# Patient Record
Sex: Female | Born: 1950 | Race: White | Hispanic: No | Marital: Married | Smoking: Current every day smoker
Health system: Southern US, Community
[De-identification: ages and names within clinical notes are randomized; demographics above are authoritative.]

## PROBLEM LIST (undated history)

## (undated) DIAGNOSIS — F32A Depression, unspecified: Secondary | ICD-10-CM

## (undated) DIAGNOSIS — F419 Anxiety disorder, unspecified: Secondary | ICD-10-CM

## (undated) DIAGNOSIS — F4321 Adjustment disorder with depressed mood: Secondary | ICD-10-CM

## (undated) DIAGNOSIS — Q394 Esophageal web: Secondary | ICD-10-CM

## (undated) DIAGNOSIS — Z720 Tobacco use: Secondary | ICD-10-CM

## (undated) DIAGNOSIS — G4700 Insomnia, unspecified: Secondary | ICD-10-CM

## (undated) DIAGNOSIS — T7840XA Allergy, unspecified, initial encounter: Secondary | ICD-10-CM

## (undated) DIAGNOSIS — K200 Eosinophilic esophagitis: Secondary | ICD-10-CM

## (undated) DIAGNOSIS — R1310 Dysphagia, unspecified: Secondary | ICD-10-CM

## (undated) DIAGNOSIS — F329 Major depressive disorder, single episode, unspecified: Secondary | ICD-10-CM

## (undated) DIAGNOSIS — M75100 Unspecified rotator cuff tear or rupture of unspecified shoulder, not specified as traumatic: Secondary | ICD-10-CM

## (undated) DIAGNOSIS — Z634 Disappearance and death of family member: Secondary | ICD-10-CM

## (undated) DIAGNOSIS — G2581 Restless legs syndrome: Secondary | ICD-10-CM

## (undated) DIAGNOSIS — M549 Dorsalgia, unspecified: Secondary | ICD-10-CM

## (undated) DIAGNOSIS — R059 Cough, unspecified: Secondary | ICD-10-CM

## (undated) DIAGNOSIS — K219 Gastro-esophageal reflux disease without esophagitis: Secondary | ICD-10-CM

## (undated) DIAGNOSIS — R05 Cough: Secondary | ICD-10-CM

## (undated) DIAGNOSIS — J449 Chronic obstructive pulmonary disease, unspecified: Secondary | ICD-10-CM

## (undated) DIAGNOSIS — Z9889 Other specified postprocedural states: Secondary | ICD-10-CM

## (undated) DIAGNOSIS — E785 Hyperlipidemia, unspecified: Secondary | ICD-10-CM

## (undated) DIAGNOSIS — R002 Palpitations: Secondary | ICD-10-CM

## (undated) HISTORY — PX: ABDOMINAL HYSTERECTOMY: SHX81

## (undated) HISTORY — DX: Chronic obstructive pulmonary disease, unspecified: J44.9

## (undated) HISTORY — PX: ESOPHAGOGASTRODUODENOSCOPY: SHX1529

## (undated) HISTORY — PX: SKIN CANCER EXCISION: SHX779

## (undated) HISTORY — PX: OTHER SURGICAL HISTORY: SHX169

## (undated) HISTORY — PX: BREAST LUMPECTOMY: SHX2

---

## 2009-07-01 ENCOUNTER — Ambulatory Visit: Payer: Self-pay | Admitting: Family Medicine

## 2010-05-04 ENCOUNTER — Ambulatory Visit: Payer: Self-pay | Admitting: Family Medicine

## 2010-11-27 DIAGNOSIS — G2581 Restless legs syndrome: Secondary | ICD-10-CM | POA: Insufficient documentation

## 2010-11-27 DIAGNOSIS — Z87891 Personal history of nicotine dependence: Secondary | ICD-10-CM | POA: Insufficient documentation

## 2010-11-27 DIAGNOSIS — K200 Eosinophilic esophagitis: Secondary | ICD-10-CM | POA: Insufficient documentation

## 2010-11-27 DIAGNOSIS — Q394 Esophageal web: Secondary | ICD-10-CM | POA: Insufficient documentation

## 2010-11-27 DIAGNOSIS — K219 Gastro-esophageal reflux disease without esophagitis: Secondary | ICD-10-CM | POA: Insufficient documentation

## 2010-11-27 DIAGNOSIS — M549 Dorsalgia, unspecified: Secondary | ICD-10-CM | POA: Insufficient documentation

## 2010-11-27 DIAGNOSIS — F419 Anxiety disorder, unspecified: Secondary | ICD-10-CM | POA: Insufficient documentation

## 2010-11-27 DIAGNOSIS — G4700 Insomnia, unspecified: Secondary | ICD-10-CM | POA: Insufficient documentation

## 2011-06-06 DIAGNOSIS — F329 Major depressive disorder, single episode, unspecified: Secondary | ICD-10-CM | POA: Insufficient documentation

## 2011-06-07 DIAGNOSIS — F4321 Adjustment disorder with depressed mood: Secondary | ICD-10-CM | POA: Insufficient documentation

## 2011-07-19 DIAGNOSIS — R002 Palpitations: Secondary | ICD-10-CM | POA: Insufficient documentation

## 2011-07-19 DIAGNOSIS — Z8639 Personal history of other endocrine, nutritional and metabolic disease: Secondary | ICD-10-CM | POA: Insufficient documentation

## 2011-07-19 DIAGNOSIS — R05 Cough: Secondary | ICD-10-CM | POA: Insufficient documentation

## 2011-08-13 ENCOUNTER — Ambulatory Visit: Payer: Self-pay

## 2011-11-01 ENCOUNTER — Ambulatory Visit: Payer: Self-pay

## 2011-12-25 ENCOUNTER — Ambulatory Visit: Payer: Self-pay | Admitting: Ophthalmology

## 2012-01-15 ENCOUNTER — Ambulatory Visit: Payer: Self-pay | Admitting: Ophthalmology

## 2012-10-09 ENCOUNTER — Ambulatory Visit: Payer: Self-pay

## 2013-01-27 DIAGNOSIS — R933 Abnormal findings on diagnostic imaging of other parts of digestive tract: Secondary | ICD-10-CM | POA: Insufficient documentation

## 2013-01-27 DIAGNOSIS — IMO0001 Reserved for inherently not codable concepts without codable children: Secondary | ICD-10-CM | POA: Insufficient documentation

## 2013-04-07 ENCOUNTER — Ambulatory Visit: Payer: Self-pay | Admitting: Physician Assistant

## 2013-12-17 ENCOUNTER — Ambulatory Visit: Payer: Self-pay | Admitting: Gastroenterology

## 2014-06-08 NOTE — Op Note (Signed)
PATIENT NAME:  Laura Bates, Laura Bates MR#:  [id number] DATE OF BIRTH:  Aug 31, 1950  DATE OF PROCEDURE:  01/15/2012  PREOPERATIVE DIAGNOSIS: Visually significant cataract of the right eye.   POSTOPERATIVE DIAGNOSIS: Visually significant cataract of the right eye.   OPERATIVE PROCEDURE: Cataract extraction by phacoemulsification with implant of intraocular lens to right eye.   SURGEON: Birder Robson, MD.   ANESTHESIA:  1. Managed anesthesia care.  2. Topical tetracaine drops followed by 2% Xylocaine jelly applied in the preoperative holding area.   COMPLICATIONS: None.   TECHNIQUE:  Stop and chop.   DESCRIPTION OF PROCEDURE: The patient was examined and consented in the preoperative holding area where the aforementioned topical anesthesia was applied to the right eye and then brought back to the Operating Room where the right eye was prepped and draped in the usual sterile ophthalmic fashion and a lid speculum was placed. A paracentesis was created with the side port blade and the anterior chamber was filled with viscoelastic. A near clear corneal incision was performed with the steel keratome. A continuous curvilinear capsulorrhexis was performed with a cystotome followed by the capsulorrhexis forceps. Hydrodissection and hydrodelineation were carried out with BSS on a blunt cannula. The lens was removed in a stop and chop technique and the remaining cortical material was removed with the irrigation-aspiration handpiece. The capsular bag was inflated with viscoelastic and the Tecnis ZCB00 22.5-diopter lens, serial number [id number] was placed in the capsular bag without complication. The remaining viscoelastic was removed from the eye with the irrigation-aspiration handpiece. The wounds were hydrated. The anterior chamber was flushed with Miostat and the eye was inflated to physiologic pressure. The wounds were found to be water tight. The eye was dressed with Vigamox. The patient was given protective  glasses to wear throughout the day and a shield with which to sleep tonight. The patient was also given drops with which to begin a drop regimen today and will follow-up with me in one day.  ____________________________ Livingston Diones. Meghna Hagmann, MD wlp:[id number] D: 01/15/2012 16:32:32 ET T: 01/15/2012 16:49:54 ET JOB#: [id number]  cc: Zalayah Pizzuto L. Sharell Hilmer, MD, <Dictator> Livingston Diones Alechia Lezama MD ELECTRONICALLY SIGNED 01/24/2012 15:44

## 2014-06-11 ENCOUNTER — Ambulatory Visit
Admit: 2014-06-11 | Disposition: A | Discharge status: Home / Self Care | Payer: Self-pay | Attending: Family Medicine | Admitting: Family Medicine

## 2014-06-14 LAB — SURGICAL PATHOLOGY

## 2014-07-22 DIAGNOSIS — S43421A Sprain of right rotator cuff capsule, initial encounter: Secondary | ICD-10-CM | POA: Insufficient documentation

## 2014-07-22 DIAGNOSIS — S46119A Strain of muscle, fascia and tendon of long head of biceps, unspecified arm, initial encounter: Secondary | ICD-10-CM | POA: Insufficient documentation

## 2014-08-05 ENCOUNTER — Encounter: Payer: Self-pay | Admitting: Psychiatry

## 2014-08-05 ENCOUNTER — Ambulatory Visit (INDEPENDENT_AMBULATORY_CARE_PROVIDER_SITE_OTHER): Payer: Managed Care, Other (non HMO) | Admitting: Psychiatry

## 2014-08-05 VITALS — BP 125/79 | HR 87 | Resp 12

## 2014-08-05 DIAGNOSIS — F4310 Post-traumatic stress disorder, unspecified: Secondary | ICD-10-CM

## 2014-08-05 DIAGNOSIS — F332 Major depressive disorder, recurrent severe without psychotic features: Secondary | ICD-10-CM

## 2014-08-05 MED ORDER — LORAZEPAM 1 MG PO TABS: 0.5000 mg | ORAL_TABLET | Freq: Two times a day (BID) | ORAL | Status: DC

## 2014-08-05 MED ORDER — ARIPIPRAZOLE 10 MG PO TABS: 10.0000 mg | ORAL_TABLET | Freq: Every day | ORAL | Status: DC

## 2014-08-05 MED ORDER — TRAZODONE HCL 100 MG PO TABS: 150.0000 mg | ORAL_TABLET | Freq: Every evening | ORAL | Status: DC | PRN

## 2014-08-05 MED ORDER — DULOXETINE HCL 60 MG PO CPEP: 60.0000 mg | ORAL_CAPSULE | Freq: Every day | ORAL | Status: DC

## 2014-08-05 MED ORDER — BUPROPION HCL ER (SR) 100 MG PO TB12: 100.0000 mg | ORAL_TABLET | Freq: Two times a day (BID) | ORAL | Status: DC

## 2014-08-05 NOTE — Progress Notes (Signed)
BH MD/PA/NP OP Progress Note  08/05/2014 4:08 PM Laura Bates  MRN:  [id number]  Subjective:  Patient states today verbalizing that she does not think her mood is ever going to get better. She states the loss of both of her sons in close proximity of each other has had a profound effect on her and she does not believe her mood is going to get better. She states however things have not gotten worse. Her biggest complaint is low energy and just a lack of drive to be able to do anything. She states she's only been taking her Wellbutrin once a day despite it being prescribed 3 times a day. She states she's been taking it at bedtime and I explained to her that Wellbutrin is a medication which is best taken earlier in the day and certainly not good take at bedtime.  We discussed perhaps trying to augment her antidepressant. She is very getting Abilify however given that she is been feeling chronically fatigued and not had any significant improvement in her depression in months. Per review of her old records Abilify was started around September 2015. We have decided we will try augmentation with Rexulti. Also instructed her to take Wellbutrin SR 100 mg twice a day as opposed to the 100 mg at bedtime that she was taking. She will continue the Cymbalta for now. She inquired about whether medications are making her sleepy. I did discuss that the ones that are most well-known to make her sleepy would be her lorazepam or trazodone. However she stated that she needs the lorazepam to help her sleep. Chief Complaint:  Chief Complaint    Depression     Visit Diagnosis:  No diagnosis found.  Past Medical History: No past medical history on file. No past surgical history on file. Family History: No family history on file. Social History:  History   Social History  . Marital Status: Married    Spouse Name: N/A  . Number of Children: N/A  . Years of Education: N/A   Social History Main Topics  . Smoking  status: Current Every Day Smoker -- 1.00 packs/day    Types: Cigarettes  . Smokeless tobacco: Not on file  . Alcohol Use: No  . Drug Use: No  . Sexual Activity: Not on file   Other Topics Concern  . None   Social History Narrative  . None   Additional History:   Assessment:   Musculoskeletal: Strength & Muscle Tone: within normal limits Gait & Station: normal Patient leans: N/A  Psychiatric Specialty Exam: HPI  Review of Systems  Psychiatric/Behavioral: Positive for depression (Patient relates that her mood has not changed and associates this with the loss of both of her sons. She states it has not worsened but has not gotten better.). Negative for suicidal ideas, hallucinations, memory loss and substance abuse. The patient has insomnia. The patient is not nervous/anxious.     Blood pressure 125/79, pulse 87, resp. rate 12.There is no height or weight on file to calculate BMI.  General Appearance: Neat and Well Groomed  Eye Contact:  Good  Speech:  Normal Rate  Volume:  Normal  Mood:  The same  Affect:  Constricted  Thought Process:  Linear  Orientation:  Full (Time, Place, and Person)  Thought Content:  Negative  Suicidal Thoughts:  No  Homicidal Thoughts:  No  Memory:  Immediate;   Good Recent;   Good Remote;   Good  Judgement:  Good  Insight:  Good  Psychomotor Activity:  Negative  Concentration:  Good  Recall:  Good  Fund of Knowledge: Good  Language: Good  Akathisia:  Negative  Handed:  Rightunknown  AIMS (if indicated):  Not done  Assets:  Communication Skills Desire for Improvement Social Support  ADL's:  Intact  Cognition: WNL  Sleep:  poor   Is the patient at risk to self?  No. Has the patient been a risk to self in the past 6 months?  No. Has the patient been a risk to self within the distant past?  No. Is the patient a risk to others?  No. Has the patient been a risk to others in the past 6 months?  No. Has the patient been a risk to others  within the distant past?  No.  Current Medications: Current Outpatient Prescriptions  Medication Sig Dispense Refill  . ARIPiprazole (ABILIFY) 5 MG tablet Take by mouth.    Marland Kitchen buPROPion (WELLBUTRIN SR) 100 MG 12 hr tablet Take 1 tablet (100 mg total) by mouth 2 (two) times daily. 60 tablet 2  . buPROPion (WELLBUTRIN) 100 MG tablet Take by mouth.    . DULoxetine (CYMBALTA) 60 MG capsule Take by mouth.    . EPINEPHrine (EPIPEN 2-PAK) 0.3 mg/0.3 mL IJ SOAJ injection     . primidone (MYSOLINE) 50 MG tablet Take by mouth.    . traMADol (ULTRAM) 50 MG tablet Take by mouth.    . traZODone (DESYREL) 100 MG tablet Take by mouth.    . ARIPiprazole (ABILIFY) 10 MG tablet Take 10 mg by mouth daily.  0  . cetirizine ([employer] INDOOR/OUTDOOR ALLERGY RLF) 10 MG tablet Take by mouth.    . doxycycline (VIBRAMYCIN) 100 MG capsule Take 100 mg by mouth 2 (two) times daily.  0  . DULoxetine (CYMBALTA) 60 MG capsule Take 1 capsule (60 mg total) by mouth daily. 30 capsule 2  . LORazepam (ATIVAN) 1 MG tablet Take 0.5 tablets (0.5 mg total) by mouth 2 (two) times daily. 60 tablet 2  . omeprazole (PRILOSEC) 40 MG capsule Take by mouth.    . predniSONE (DELTASONE) 10 MG tablet take 6 tablets by mouth for 2 days then 4 tablets for 2 days then...  (REFER TO PRESCRIPTION NOTES).  0  . primidone (MYSOLINE) 50 MG tablet   1  . traMADol (ULTRAM) 50 MG tablet take 1 tablet by mouth every 6 hours if needed for pain  0  . traZODone (DESYREL) 100 MG tablet Take 1.5 tablets (150 mg total) by mouth at bedtime as needed for sleep. 45 tablet 2   No current facility-administered medications for this visit.    Medical Decision Making:  Review of Medication Regimen & Side Effects (2) and Review of New Medication or Change in Dosage (2)  Treatment Plan Summary:Medication management patient will continue her Cymbalta 60 mg a day. She will continue her lorazepam 1 mg twice daily. She'll continue her trazodone 150 mg at bedtime. I have  instructed her to discontinue the Abilify and provide her some samples of rexulti. I have instructed her to take 0.5 mg for 7 days and then go up to 1 mg a day. Risk and benefits of been discussing patient's able to consent. I have instructed patient follow up in 1 month. I've also instructed to call me with any questions or concerns regarding the trial of Rexulti.    Faith Rogue 08/05/2014, 4:08 PM

## 2014-08-05 NOTE — Addendum Note (Signed)
Addended by: Marjie Skiff on: 08/05/2014 04:41 PM   Modules accepted: Orders

## 2014-08-16 ENCOUNTER — Other Ambulatory Visit: Payer: Self-pay

## 2014-08-16 DIAGNOSIS — T7840XA Allergy, unspecified, initial encounter: Secondary | ICD-10-CM | POA: Insufficient documentation

## 2014-08-16 NOTE — Telephone Encounter (Signed)
pt states she seen you on 08-05-14 and that you gave her some samples of rexulti 1mg  and pt states she is doing ok on it and would like for you to send a rx to [facility] in [address]

## 2014-08-17 MED ORDER — BREXPIPRAZOLE 1 MG PO TABS: 1.0000 | ORAL_TABLET | Freq: Every day | ORAL | Status: DC

## 2014-08-18 NOTE — Telephone Encounter (Signed)
spoke with patient rx was sent and prior authorization was done and sent to insurance company

## 2014-10-12 ENCOUNTER — Other Ambulatory Visit: Payer: Self-pay | Admitting: Psychiatry

## 2014-10-14 DIAGNOSIS — Z9889 Other specified postprocedural states: Secondary | ICD-10-CM | POA: Insufficient documentation

## 2014-10-19 ENCOUNTER — Encounter: Payer: Self-pay | Admitting: Psychiatry

## 2014-10-19 ENCOUNTER — Ambulatory Visit (INDEPENDENT_AMBULATORY_CARE_PROVIDER_SITE_OTHER): Payer: Managed Care, Other (non HMO) | Admitting: Psychiatry

## 2014-10-19 VITALS — BP 124/76 | HR 90 | Temp 98.3°F | Ht 62.5 in | Wt 124.8 lb

## 2014-10-19 DIAGNOSIS — F332 Major depressive disorder, recurrent severe without psychotic features: Secondary | ICD-10-CM

## 2014-10-19 DIAGNOSIS — F4310 Post-traumatic stress disorder, unspecified: Secondary | ICD-10-CM

## 2014-10-19 MED ORDER — DULOXETINE HCL 60 MG PO CPEP: 60.0000 mg | ORAL_CAPSULE | Freq: Every day | ORAL | Status: AC

## 2014-10-19 MED ORDER — BUPROPION HCL ER (SR) 100 MG PO TB12: 100.0000 mg | ORAL_TABLET | Freq: Two times a day (BID) | ORAL | Status: AC

## 2014-10-19 MED ORDER — LORAZEPAM 1 MG PO TABS: 1.0000 mg | ORAL_TABLET | Freq: Two times a day (BID) | ORAL | Status: DC

## 2014-10-19 MED ORDER — TRAZODONE HCL 100 MG PO TABS: 150.0000 mg | ORAL_TABLET | Freq: Every evening | ORAL | Status: AC | PRN

## 2014-10-19 NOTE — Progress Notes (Signed)
BH MD/PA/NP OP Progress Note  10/19/2014 3:23 PM Laura Bates  MRN:  [id number]  Subjective:  Patient states today indicating that things have been the same. She is able to state they're not any better and are not any worse. She initially had contacted the clinic saying the samples of Rick salty worked and thus Armed forces training and education officer sent in a prescription. However patient states that there was a lot of trouble with her insurance she never followed through with trying to continue it but then went back on her Abilify. She states however the past couple months she's noticed that she has been touching her thighs and having some deliberate movements which she is able to stop when she notices it. However I did discuss that there is a side effect of the akathisia/restlessness from Abilify. We've decided we will give a try with her discontinuing the Abilify.  He did remind this Probation officer that her dose of lorazepam been 1 mg twice a day and at the last visit it was written for 0.5 mg twice a day.    Past about any new events or anything she is enjoyed. She states that over the summer she and her husband did move. She states that they've always owned a home and they decided to sell their home and move toward apartment. She states she is surprised because she likes this change because it does not, with all the work that required in up keeping house. Chief Complaint: Same Chief Complaint    Follow-up; Medication Refill; Anxiety; Depression     Visit Diagnosis:  No diagnosis found.  Past Medical History: History reviewed. No pertinent past medical history.  Past Surgical History  Procedure Laterality Date  . Abdominal hysterectomy    . Skin cancer excision     Family History:  Family History  Problem Relation Age of Onset  . Heart attack Father   . Diabetes Father    Social History:  Social History   Social History  . Marital Status: Married    Spouse Name: N/A  . Number of Children: N/A  . Years of  Education: N/A   Social History Main Topics  . Smoking status: Current Every Day Smoker -- 1.00 packs/day    Types: Cigarettes    Start date: 10/19/1974  . Smokeless tobacco: Never Used  . Alcohol Use: No  . Drug Use: No  . Sexual Activity: Yes    Birth Control/ Protection: None   Other Topics Concern  . None   Social History Narrative   Additional History:   Assessment:   Musculoskeletal: Strength & Muscle Tone: within normal limits Gait & Station: normal Patient leans: N/A  Psychiatric Specialty Exam: Anxiety Symptoms include insomnia. Patient reports no nervous/anxious behavior or suicidal ideas.    Depression        Associated symptoms include insomnia.  Associated symptoms include no suicidal ideas.  Past medical history includes anxiety.     Review of Systems  Psychiatric/Behavioral: Positive for depression (at baseline). Negative for suicidal ideas, hallucinations, memory loss and substance abuse. The patient has insomnia. The patient is not nervous/anxious.     Blood pressure 124/76, pulse 90, temperature 98.3 F (36.8 C), temperature source Tympanic, height 5' 2.5" (1.588 m), weight 124 lb 12.8 oz (56.609 kg), SpO2 94 %.Body mass index is 22.45 kg/(m^2).  General Appearance: Neat and Well Groomed  Eye Contact:  Good  Speech:  Normal Rate  Volume:  Normal  Mood:  The same  Affect:  Constricted  Thought Process:  Linear  Orientation:  Full (Time, Place, and Person)  Thought Content:  Negative  Suicidal Thoughts:  No  Homicidal Thoughts:  No  Memory:  Immediate;   Good Recent;   Good Remote;   Good  Judgement:  Good  Insight:  Good  Psychomotor Activity:  Negative  Concentration:  Good  Recall:  Good  Fund of Knowledge: Good  Language: Good  Akathisia:  Negative  Handed:  Rightunknown  AIMS (if indicated):  Done today, normal. Asian and is treated by her primary care for benign essential tremor with primidone   Assets:  Communication Skills Desire  for Improvement Social Support  ADL's:  Intact  Cognition: WNL  Sleep:  poor   Is the patient at risk to self?  No. Has the patient been a risk to self in the past 6 months?  No. Has the patient been a risk to self within the distant past?  No. Is the patient a risk to others?  No. Has the patient been a risk to others in the past 6 months?  No. Has the patient been a risk to others within the distant past?  No.  Current Medications: Current Outpatient Prescriptions  Medication Sig Dispense Refill  . buPROPion (WELLBUTRIN SR) 100 MG 12 hr tablet take 1 tablet by mouth twice a day 60 tablet 1  . cetirizine ([employer] INDOOR/OUTDOOR ALLERGY RLF) 10 MG tablet Take by mouth.    . DULoxetine (CYMBALTA) 60 MG capsule Take 1 capsule (60 mg total) by mouth daily. 30 capsule 2  . LORazepam (ATIVAN) 1 MG tablet Take 1 tablet (1 mg total) by mouth 2 (two) times daily. 60 tablet 3  . omeprazole (PRILOSEC) 40 MG capsule Take by mouth.    . primidone (MYSOLINE) 50 MG tablet Take by mouth.    . primidone (MYSOLINE) 50 MG tablet   1  . traZODone (DESYREL) 100 MG tablet Take 1.5 tablets (150 mg total) by mouth at bedtime as needed for sleep. 45 tablet 2  . traZODone (DESYREL) 100 MG tablet Take by mouth.    . doxycycline (VIBRAMYCIN) 100 MG capsule Take 100 mg by mouth 2 (two) times daily.  0  . DULoxetine (CYMBALTA) 60 MG capsule Take by mouth.     No current facility-administered medications for this visit.    Medical Decision Making:  Review of Medication Regimen & Side Effects (2) and Review of New Medication or Change in Dosage (2)  Treatment Plan Summary:Medication management patient will continue her Cymbalta 60 mg a day. She will continue her lorazepam 1 mg twice daily. She'll continue her trazodone 150 mg at bedtime.  She'll continue the Wellbutrin SR 100 mg twice a day. She will follow up in 4 months. Patient wanted to extend the visits until 6 months however I indicated that I was comfortable  with 4 months. She has been stable on this medication regimen. Her to call any questions or concerns prior to her next appointment. Discontinue the Abilify as noted above     Faith Rogue 10/19/2014, 3:23 PM

## 2014-10-19 NOTE — Addendum Note (Signed)
Addended by: Marjie Skiff on: 10/19/2014 03:33 PM   Modules accepted: Orders

## 2014-10-22 ENCOUNTER — Ambulatory Visit: Payer: Self-pay | Admitting: Psychiatry

## 2014-10-22 ENCOUNTER — Other Ambulatory Visit: Payer: Self-pay | Admitting: Family Medicine

## 2014-11-02 ENCOUNTER — Ambulatory Visit: Payer: Self-pay | Admitting: Psychiatry

## 2014-11-09 ENCOUNTER — Other Ambulatory Visit: Payer: Self-pay | Admitting: Psychiatry

## 2015-02-09 ENCOUNTER — Ambulatory Visit: Payer: Self-pay | Admitting: Psychiatry

## 2015-02-18 ENCOUNTER — Emergency Department: Payer: Medicare Other

## 2015-02-18 ENCOUNTER — Emergency Department
Admission: EM | Admit: 2015-02-18 | Discharge: 2015-02-18 | Disposition: A | Discharge status: Home / Self Care | Payer: Medicare Other | Attending: Emergency Medicine | Admitting: Emergency Medicine

## 2015-02-18 DIAGNOSIS — Z880 Allergy status to penicillin: Secondary | ICD-10-CM | POA: Diagnosis not present

## 2015-02-18 DIAGNOSIS — Z7952 Long term (current) use of systemic steroids: Secondary | ICD-10-CM | POA: Insufficient documentation

## 2015-02-18 DIAGNOSIS — Z79899 Other long term (current) drug therapy: Secondary | ICD-10-CM | POA: Diagnosis not present

## 2015-02-18 DIAGNOSIS — Z792 Long term (current) use of antibiotics: Secondary | ICD-10-CM | POA: Insufficient documentation

## 2015-02-18 DIAGNOSIS — J209 Acute bronchitis, unspecified: Secondary | ICD-10-CM

## 2015-02-18 DIAGNOSIS — R0602 Shortness of breath: Secondary | ICD-10-CM | POA: Diagnosis present

## 2015-02-18 DIAGNOSIS — F17210 Nicotine dependence, cigarettes, uncomplicated: Secondary | ICD-10-CM | POA: Diagnosis not present

## 2015-02-18 LAB — CBC WITH DIFFERENTIAL/PLATELET
BASOS ABS: 0.1 10*3/uL (ref 0–0.1)
Basophils Relative: 1 %
EOS PCT: 1 %
Eosinophils Absolute: 0.1 10*3/uL (ref 0–0.7)
HCT: 40.6 % (ref 35.0–47.0)
Hemoglobin: 13.3 g/dL (ref 12.0–16.0)
Lymphocytes Relative: 33 %
Lymphs Abs: 3.7 10*3/uL — ABNORMAL HIGH (ref 1.0–3.6)
MCH: 30.5 pg (ref 26.0–34.0)
MCHC: 32.9 g/dL (ref 32.0–36.0)
MCV: 92.9 fL (ref 80.0–100.0)
Monocytes Absolute: 0.7 10*3/uL (ref 0.2–0.9)
Monocytes Relative: 6 %
Neutro Abs: 6.7 10*3/uL — ABNORMAL HIGH (ref 1.4–6.5)
Neutrophils Relative %: 59 %
Platelets: 212 10*3/uL (ref 150–440)
RBC: 4.37 MIL/uL (ref 3.80–5.20)
RDW: 15.2 % — ABNORMAL HIGH (ref 11.5–14.5)
WBC: 11.3 10*3/uL — ABNORMAL HIGH (ref 3.6–11.0)

## 2015-02-18 LAB — BASIC METABOLIC PANEL
Anion gap: 9 (ref 5–15)
BUN: 12 mg/dL (ref 6–20)
CO2: 25 mmol/L (ref 22–32)
Calcium: 9.2 mg/dL (ref 8.9–10.3)
Chloride: 103 mmol/L (ref 101–111)
Creatinine, Ser: 0.88 mg/dL (ref 0.44–1.00)
GFR calc Af Amer: >60 mL/min (ref >60)
Glucose, Bld: 92 mg/dL (ref 65–99)
Potassium: 3.9 mmol/L (ref 3.5–5.1)
Sodium: 137 mmol/L (ref 135–145)

## 2015-02-18 LAB — FIBRIN DERIVATIVES D-DIMER (ARMC ONLY): FIBRIN DERIVATIVES D-DIMER (ARMC): 461 (ref 0–499)

## 2015-02-18 MED ORDER — IPRATROPIUM-ALBUTEROL 0.5-2.5 (3) MG/3ML IN SOLN: 3.0000 mL | Freq: Once | RESPIRATORY_TRACT | Status: DC

## 2015-02-18 MED ORDER — SODIUM CHLORIDE 0.9 % IV BOLUS (SEPSIS)
1000.0000 mL | Freq: Once | INTRAVENOUS | Status: AC
  Administered 2015-02-18: 1000 mL via INTRAVENOUS

## 2015-02-18 MED ORDER — AZITHROMYCIN 250 MG PO TABS: ORAL_TABLET | ORAL | Status: DC

## 2015-02-18 MED ORDER — ALBUTEROL SULFATE HFA 108 (90 BASE) MCG/ACT IN AERS: 2.0000 | INHALATION_SPRAY | RESPIRATORY_TRACT | Status: DC | PRN

## 2015-02-18 MED ORDER — PREDNISONE 20 MG PO TABS: 40.0000 mg | ORAL_TABLET | Freq: Every day | ORAL | Status: DC

## 2015-02-18 MED ORDER — PREDNISONE 20 MG PO TABS: 40.0000 mg | ORAL_TABLET | Freq: Once | ORAL | Status: DC

## 2015-02-18 NOTE — Discharge Instructions (Signed)

## 2015-02-18 NOTE — ED Notes (Signed)
Generalized weakness X 5 weeks and SOB X 3 days. Primary care reports that pt oxygen was 88% on RA. Pt sent for further evaluation of PE. Pt denies pain. No swelling/edema present. Pt alert and oriented X4, active, cooperative, pt in NAD. RR even and unlabored, color WNL.

## 2015-02-18 NOTE — ED Provider Notes (Signed)
[facility] Emergency Department Provider Note  ____________________________________________  Time seen: 1:10 PM  I have reviewed the triage vital signs and the nursing notes.   HISTORY  Chief Complaint Shortness of Breath    HPI Laura Bates is a 64 y.o. female who complains of generalized weakness for 5 weeks and shortness of breath for 3 days. Also has a nonproductive cough. Was seen in clinic and found to have an oxygen saturation 88% on room air. Was sent to the ED for evaluation for PE due to family history of PE. Patient does report that both of her sons recently died and she's been on antidepressants for the past few months. Denies any chest pain. No travel trauma hospitalizations or surgeries. Has had decreased activity over the past month. Denies any leg pain or swelling. No dizziness or dyspnea on exertion.     History reviewed. No pertinent past medical history.   Patient Active Problem List   Diagnosis Date Noted  . H/O shoulder surgery 10/14/2014  . Allergic state 08/16/2014  . Rupture long head biceps tendon 07/22/2014  . Sprain of right rotator cuff capsule 07/22/2014  . Can't get food down 01/27/2013  . Abnormal finding on GI tract imaging 01/27/2013  . Cough 07/19/2011  . H/O elevated lipids 07/19/2011  . Awareness of heartbeats 07/19/2011  . Aggrieved 06/07/2011  . Clinical depression 06/06/2011  . Acid reflux 11/27/2010  . Anxiety 11/27/2010  . Back ache 11/27/2010  . EE (eosinophilic esophagitis) [id number]  . 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 History of tobacco use 11/27/2010  . Cannot sleep 11/27/2010  . Restless leg 11/27/2010     Past Surgical History  Procedure Laterality Date  . Abdominal hysterectomy    . Skin cancer excision       Current Outpatient Rx  Name  Route  Sig  Dispense  Refill  . albuterol (PROVENTIL HFA) 108 (90 Base) MCG/ACT inhaler   Inhalation   Inhale 2 puffs into the lungs every 4  (four) hours as needed for wheezing or shortness of breath.   1 Inhaler   0   . ARIPiprazole (ABILIFY) 10 MG tablet      TAKE 1 TABLET BY MOUTH DAILY ,( DO NOT TAKE WITH REXULTI)   30 tablet   3   . azithromycin (ZITHROMAX Z-PAK) 250 MG tablet      Take 2 tablets (500 mg) on  Day 1,  followed by 1 tablet (250 mg) once daily on Days 2 through 5.   6 each   0   . buPROPion (WELLBUTRIN SR) 100 MG 12 hr tablet   Oral   Take 1 tablet (100 mg total) by mouth 2 (two) times daily.   60 tablet   3     Hold until patient calls pharmacy for refill as sh ...   . cetirizine ([employer] INDOOR/OUTDOOR ALLERGY RLF) 10 MG tablet   Oral   Take by mouth.         . doxycycline (VIBRAMYCIN) 100 MG capsule   Oral   Take 100 mg by mouth 2 (two) times daily.      0   . DULoxetine (CYMBALTA) 60 MG capsule   Oral   Take by mouth.         . DULoxetine (CYMBALTA) 60 MG capsule   Oral   Take 1 capsule (60 mg total) by mouth daily.   30 capsule   3     Hold until patient  calls pharmacy for refill as sh ...   . LORazepam (ATIVAN) 1 MG tablet   Oral   Take 1 tablet (1 mg total) by mouth 2 (two) times daily.   60 tablet   3   . omeprazole (PRILOSEC) 40 MG capsule   Oral   Take by mouth.         . predniSONE (DELTASONE) 20 MG tablet   Oral   Take 2 tablets (40 mg total) by mouth daily.   8 tablet   0   . primidone (MYSOLINE) 50 MG tablet   Oral   Take by mouth.         . primidone (MYSOLINE) 50 MG tablet            1   . traZODone (DESYREL) 100 MG tablet   Oral   Take by mouth.         . traZODone (DESYREL) 100 MG tablet   Oral   Take 1.5 tablets (150 mg total) by mouth at bedtime as needed for sleep.   45 tablet   3     Hold until patient calls pharmacy for refill as sh ...      Allergies Oxycodone; Cephalexin; Hydrocodone; and Penicillins   Family History  Problem Relation Age of Onset  . Heart attack Father   . Diabetes Father     Social  History Social History  Substance Use Topics  . Smoking status: Current Every Day Smoker -- 1.00 packs/day    Types: Cigarettes    Start date: 10/19/1974  . Smokeless tobacco: Never Used  . Alcohol Use: No    Review of Systems  Constitutional:   No fever or chills. No weight changes Eyes:   No blurry vision or double vision.  ENT:   No sore throat. Cardiovascular:   No chest pain. Respiratory:   Positive shortness of breath and occasional nonproductive cough Gastrointestinal:   Negative for abdominal pain, vomiting and diarrhea.  No BRBPR or melena. Genitourinary:   Negative for dysuria, urinary retention, bloody urine, or difficulty urinating. Musculoskeletal:   Negative for back pain. No joint swelling or pain. Skin:   Negative for rash. Neurological:   Negative for headaches, focal weakness or numbness. Psychiatric:  No anxiety or depression.   Endocrine:  No hot/cold intolerance, changes in energy, or sleep difficulty.  10-point ROS otherwise negative.  ____________________________________________   PHYSICAL EXAM:  VITAL SIGNS: ED Triage Vitals  Enc Vitals Group     BP 02/18/15 1317 130/53 mmHg     Pulse Rate 02/18/15 1317 78     Resp 02/18/15 1317 18     Temp 02/18/15 1317 98.2 F (36.8 C)     Temp Source 02/18/15 1317 Oral     SpO2 02/18/15 1317 98 %     Weight 02/18/15 1317 130 lb (58.968 kg)     Height 02/18/15 1317 5\' 2"  (1.575 m)     Head Cir --      Peak Flow --      Pain Score --      Pain Loc --      Pain Edu? --      Excl. in [place]? --     Vital signs reviewed, nursing assessments reviewed.   Constitutional:   Alert and oriented. Well appearing and in no distress. Eyes:   No scleral icterus. No conjunctival pallor. PERRL. EOMI ENT   Head:   Normocephalic and atraumatic.   Nose:   No  congestion/rhinnorhea. No septal hematoma   Mouth/Throat:   MMM, no pharyngeal erythema. No peritonsillar mass. No uvula shift.   Neck:   No stridor.  No SubQ emphysema. No meningismus. Hematological/Lymphatic/Immunilogical:   No cervical lymphadenopathy. Cardiovascular:   RRR. Normal and symmetric distal pulses are present in all extremities. No murmurs, rubs, or gallops. Respiratory:   Normal respiratory effort without tachypnea nor retractions. Diffuse mild expiratory wheezing, accentuated with forcible expiration. Normal expiratory phase. Oxygen saturation 98% on room air Gastrointestinal:   Soft and nontender. No distention. There is no CVA tenderness.  No rebound, rigidity, or guarding. Genitourinary:   deferred Musculoskeletal:   Nontender with normal range of motion in all extremities. No joint effusions.  No lower extremity tenderness.  No edema. Symmetric. No Soft Tissue Tenderness Anywhere in the Bilateral Lower Extremity Is.  Neurologic:   Normal speech and language.  CN 2-10 normal. Motor grossly intact. No pronator drift.  Normal gait. No gross focal neurologic deficits are appreciated.  Skin:    Skin is warm, dry and intact. No rash noted.  No petechiae, purpura, or bullae. Psychiatric:   Mood and affect are normal. Speech and behavior are normal. Patient exhibits appropriate insight and judgment.  ____________________________________________    LABS (pertinent positives/negatives) (all labs ordered are listed, but only abnormal results are displayed) Labs Reviewed  CBC WITH DIFFERENTIAL/PLATELET - Abnormal; Notable for the following:    WBC 11.3 (*)    RDW 15.2 (*)    Neutro Abs 6.7 (*)    Lymphs Abs 3.7 (*)    All other components within normal limits  BASIC METABOLIC PANEL  FIBRIN DERIVATIVES D-DIMER ([facility] ONLY)   ____________________________________________   EKG   Interpreted by me  Date: 02/18/2015  Rate: 79  Rhythm: normal sinus rhythm  QRS Axis: normal  Intervals: normal  ST/T Wave abnormalities: normal  Conduction Disutrbances: none  Narrative Interpretation:  unremarkable      ____________________________________________    RADIOLOGY  Chest x-ray unremarkable  ____________________________________________   PROCEDURES   ____________________________________________   INITIAL IMPRESSION / ASSESSMENT AND PLAN / ED COURSE  Pertinent labs & imaging results that were available during my care of the patient were reviewed by me and considered in my medical decision making (see chart for details).  Patient presents with shortness of breath and exam the clinically is consistent with bronchospasm and bronchitis. Low suspicion for PE but given her family history and decreased activity recently, even that there are no clinical signs on exam or vital signs, we'll do a d-dimer was labs. We'll give steroids and a DuoNeb as well for symptomatically relief.  ----------------------------------------- 3:29 PM on 02/18/2015 -----------------------------------------  Workup negative. D-dimer within normal. Vital signs stable. No respiratory distress. No hypoxia tachycardia tachypnea or hypotension. No chest pain. EKG does not show any evidence of right heart strain. We'll discharge with a course of steroids albuterol and azithromycin to follow up with primary care.     ____________________________________________   FINAL CLINICAL IMPRESSION(S) / ED DIAGNOSES  Final diagnoses:  Bronchospasm with bronchitis, acute      Carrie Mew, MD 02/18/15 1530

## 2015-02-18 NOTE — ED Notes (Signed)
Pt arrives from Urgent Care by [facility] for evaluation of SOB x 3 days. Pt alert and oriented X4, active, cooperative, pt in NAD. RR even and unlabored, color WNL.

## 2015-03-09 ENCOUNTER — Encounter: Payer: Self-pay | Admitting: Cardiovascular Disease

## 2015-03-09 ENCOUNTER — Ambulatory Visit (INDEPENDENT_AMBULATORY_CARE_PROVIDER_SITE_OTHER): Payer: Medicare Other | Admitting: Cardiovascular Disease

## 2015-03-09 VITALS — BP 94/62 | HR 88 | Ht 62.0 in | Wt 133.0 lb

## 2015-03-09 DIAGNOSIS — R0602 Shortness of breath: Secondary | ICD-10-CM

## 2015-03-09 DIAGNOSIS — Z720 Tobacco use: Secondary | ICD-10-CM | POA: Insufficient documentation

## 2015-03-09 DIAGNOSIS — R0609 Other forms of dyspnea: Secondary | ICD-10-CM | POA: Diagnosis not present

## 2015-03-09 NOTE — Patient Instructions (Addendum)
Medication Instructions:  Your physician recommends that you continue on your current medications as directed. Please refer to the Current Medication list given to you today.   Labwork: none  Testing/Procedures: Your physician has requested that you have a lexiscan myoview. For further information please visit [email or web address]. Please follow instruction sheet, as given.  [place]  Your caregiver has ordered a Stress Test with nuclear imaging. The purpose of this test is to evaluate the blood supply to your heart muscle. This procedure is referred to as a "Non-Invasive Stress Test." This is because other than having an IV started in your vein, nothing is inserted or "invades" your body. Cardiac stress tests are done to find areas of poor blood flow to the heart by determining the extent of coronary artery disease (CAD). Some patients exercise on a treadmill, which naturally increases the blood flow to your heart, while others who are  unable to walk on a treadmill due to physical limitations have a pharmacologic/chemical stress agent called Lexiscan . This medicine will mimic walking on a treadmill by temporarily increasing your coronary blood flow.   Please note: these test may take anywhere between 2-4 hours to complete  PLEASE REPORT TO [place] AT THE FIRST DESK WILL DIRECT YOU WHERE TO GO  Date of Procedure: Friday, Jan 20  Arrival Time for Procedure: 8:15am  Instructions regarding medication:   You may take your morning medications as prescribed.  Please bring your inhaler.   PLEASE NOTIFY THE OFFICE AT LEAST 85 HOURS IN ADVANCE IF YOU ARE UNABLE TO KEEP YOUR APPOINTMENT.  [phone] AND  PLEASE NOTIFY NUCLEAR MEDICINE AT [facility] AT LEAST 24 HOURS IN ADVANCE IF YOU ARE UNABLE TO KEEP YOUR APPOINTMENT. [phone]  How to prepare for your Myoview test:   Do not eat or drink after midnight  No caffeine for 24 hours prior to test  No  smoking 24 hours prior to test.  Your medication may be taken with water.  If your doctor stopped a medication because of this test, do not take that medication.  Ladies, please do not wear dresses.  Skirts or pants are appropriate. Please wear a short sleeve shirt.  No perfume, cologne or lotion.  Wear comfortable walking shoes. No heels!   Your physician has requested that you have an echocardiogram. Echocardiography is a painless test that uses sound waves to create images of your heart. It provides your doctor with information about the size and shape of your heart and how well your heart's chambers and valves are working. This procedure takes approximately one hour. There are no restrictions for this procedure.           Follow-Up: Your physician recommends that you schedule a follow-up appointment as needed.   Any Other Special Instructions Will Be Listed Below (If Applicable).     If you need a refill on your cardiac medications before your next appointment, please call your pharmacy.  Echocardiogram An echocardiogram, or echocardiography, uses sound waves (ultrasound) to produce an image of your heart. The echocardiogram is simple, painless, obtained within a short period of time, and offers valuable information to your health care provider. The images from an echocardiogram can provide information such as:  Evidence of coronary artery disease (CAD).  Heart size.  Heart muscle function.  Heart valve function.  Aneurysm detection.  Evidence of a past heart attack.  Fluid buildup around the heart.  Heart muscle thickening.  Assess heart valve  function. LET [facility] CARE PROVIDER KNOW ABOUT:  Any allergies you have.  All medicines you are taking, including vitamins, herbs, eye drops, creams, and over-the-counter medicines.  Previous problems you or members of your family have had with the use of anesthetics.  Any blood disorders you have.  Previous  surgeries you have had.  Medical conditions you have.  Possibility of pregnancy, if this applies. BEFORE THE PROCEDURE  No special preparation is needed. Eat and drink normally.  PROCEDURE   In order to produce an image of your heart, gel will be applied to your chest and a wand-like tool (transducer) will be moved over your chest. The gel will help transmit the sound waves from the transducer. The sound waves will harmlessly bounce off your heart to allow the heart images to be captured in real-time motion. These images will then be recorded.  You may need an IV to receive a medicine that improves the quality of the pictures. AFTER THE PROCEDURE You may return to your normal schedule including diet, activities, and medicines, unless your health care provider tells you otherwise.   This information is not intended to replace advice given to you by your health care provider. Make sure you discuss any questions you have with your health care provider.   Document Released: 02/03/2000 Document Revised: 02/26/2014 Document Reviewed: 10/13/2012 Elsevier Interactive Patient Education 2016 [place]. Cardiac Nuclear Scanning A cardiac nuclear scan is used to check your heart for problems, such as the following:  A portion of the heart is not getting enough blood.  Part of the heart muscle has died, which happens with a heart attack.  The heart wall is not working normally.  In this test, a radioactive dye (tracer) is injected into your bloodstream. After the tracer has traveled to your heart, a scanning device is used to measure how much of the tracer is absorbed by or distributed to various areas of your heart. LET [facility] CARE PROVIDER KNOW ABOUT:  Any allergies you have.  All medicines you are taking, including vitamins, herbs, eye drops, creams, and over-the-counter medicines.  Previous problems you or members of your family have had with the use of anesthetics.  Any blood  disorders you have.  Previous surgeries you have had.  Medical conditions you have.  RISKS AND COMPLICATIONS Generally, this is a safe procedure. However, as with any procedure, problems can occur. Possible problems include:   Serious chest pain.  Rapid heartbeat.  Sensation of warmth in your chest. This usually passes quickly. BEFORE THE PROCEDURE Ask your health care provider about changing or stopping your regular medicines. PROCEDURE This procedure is usually done at a hospital and takes 2-4 hours.  An IV tube is inserted into one of your veins.  Your health care provider will inject a small amount of radioactive tracer through the tube.  You will then wait for 20-40 minutes while the tracer travels through your bloodstream.  You will lie down on an exam table so images of your heart can be taken. Images will be taken for about 15-20 minutes.  You will exercise on a treadmill or stationary bike. While you exercise, your heart activity will be monitored with an electrocardiogram (ECG), and your blood pressure will be checked.  If you are unable to exercise, you may be given a medicine to make your heart beat faster.  When blood flow to your heart has peaked, tracer will again be injected through the IV tube.  After  20-40 minutes, you will get back on the exam table and have more images taken of your heart.  When the procedure is over, your IV tube will be removed. AFTER THE PROCEDURE  You will likely be able to leave shortly after the test. Unless your health care provider tells you otherwise, you may return to your normal schedule, including diet, activities, and medicines.  Make sure you find out how and when you will get your test results.   This information is not intended to replace advice given to you by your health care provider. Make sure you discuss any questions you have with your health care provider.   Document Released: 03/02/2004 Document Revised:  02/10/2013 Document Reviewed: 01/14/2013 Elsevier Interactive Patient Education [employer].

## 2015-03-09 NOTE — Assessment & Plan Note (Signed)
I discussed with her the importance of smoking cessation. She does want to quit and has been able to do so in the past with Chantix. She is trying to get herself off her anxiety and depression medication before she starts this. She does follow-up with psychiatry.

## 2015-03-09 NOTE — Progress Notes (Signed)
Primary care physician: Dr. Norman Clay  HPI  This is a pleasant 65 year old female who was referred by Dr. Vickki Muff for evaluation of exertional dyspnea. She has no previous cardiac history. She has prolonged history of tobacco use and recent COPD. She also suffers from anxiety and depression that was significantly exacerbated in 2013 after she lost both sons to illness.  Over the last few months, she has experienced significant exertional dyspnea which has been progressive. No orthopnea, PND or lower extremity edema. She denies any chest pain. She was noted to be mildly hypoxic recently and was sent to the emergency room where she was diagnosed with bronchitis and was given an antibiotic. She ultimately went to [facility] emergency room and had CT scan done which showed no evidence of pulmonary embolism. There were small pulmonary nodules and evidence of COPD.   the patient usually smokes one pack per day but she is trying to cut down to half a pack per day. She was able to quit in the past with Chantix. She reports that her father died of myocardial infarction at the age of 9. Her mother is alive at the age of 56. No previous cardiac evaluation.  Allergies  Allergen Reactions  . Oxycodone Itching  . Cephalexin Swelling  . Hydrocodone Other (See Comments)  . Penicillins Other (See Comments)    Other Reaction: OTHER REACTION BLEEDING THRU S     Current Outpatient Prescriptions on File Prior to Visit  Medication Sig Dispense Refill  . albuterol (PROVENTIL HFA) 108 (90 Base) MCG/ACT inhaler Inhale 2 puffs into the lungs every 4 (four) hours as needed for wheezing or shortness of breath. 1 Inhaler 0  . buPROPion (WELLBUTRIN SR) 100 MG 12 hr tablet Take 1 tablet (100 mg total) by mouth 2 (two) times daily. (Patient taking differently: Take by mouth. Takes 1/2 tablet tid.) 60 tablet 3  . cetirizine ([employer] INDOOR/OUTDOOR ALLERGY RLF) 10 MG tablet Take by mouth.    . DULoxetine (CYMBALTA) 60 MG capsule  Take 1 capsule (60 mg total) by mouth daily. 30 capsule 3  . LORazepam (ATIVAN) 1 MG tablet Take 1 tablet (1 mg total) by mouth 2 (two) times daily. (Patient taking differently: Take 1 mg by mouth daily. ) 60 tablet 3  . omeprazole (PRILOSEC) 40 MG capsule Take by mouth.    . primidone (MYSOLINE) 50 MG tablet Take 50 mg by mouth 2 (two) times daily.     . traZODone (DESYREL) 100 MG tablet Take 1.5 tablets (150 mg total) by mouth at bedtime as needed for sleep. (Patient taking differently: Take 100 mg by mouth at bedtime as needed for sleep. ) 45 tablet 3   No current facility-administered medications on file prior to visit.     Past Medical History  Diagnosis Date  . COPD, mild [facility])      Past Surgical History  Procedure Laterality Date  . Abdominal hysterectomy    . Skin cancer excision    . Benign tumor      breast     Family History  Problem Relation Age of Onset  . Heart attack Father   . Diabetes Father   . Heart Problems Maternal Grandmother      Social History   Social History  . Marital Status: Married    Spouse Name: N/A  . Number of Children: N/A  . Years of Education: N/A   Occupational History  . Not on file.   Social History Main Topics  .  Smoking status: Current Every Day Smoker -- 0.50 packs/day for 45 years    Types: Cigarettes    Start date: 10/19/1974  . Smokeless tobacco: Never Used  . Alcohol Use: No  . Drug Use: No  . Sexual Activity: Yes    Birth Control/ Protection: None   Other Topics Concern  . Not on file   Social History Narrative     ROS A 10 point review of system was performed. It is negative other than that mentioned in the history of present illness.   PHYSICAL EXAM   BP 94/62 mmHg  Pulse 88  Ht 5\' 2"  (1.575 m)  Wt 133 lb (60.328 kg)  BMI 24.32 kg/m2  LMP  (LMP Unknown) Constitutional: She is oriented to person, place, and time. She appears well-developed and well-nourished. No distress.  HENT: No nasal  discharge.  Head: Normocephalic and atraumatic.  Eyes: Pupils are equal and round. No discharge.  Neck: Normal range of motion. Neck supple. No JVD present. No thyromegaly present.  Cardiovascular: Normal rate, regular rhythm, normal heart sounds. Exam reveals no gallop and no friction rub. No murmur heard.  Pulmonary/Chest: Effort normal and diminished breath sounds . No stridor. No respiratory distress. She has no wheezes. She has no rales. She exhibits no tenderness.  Abdominal: Soft. Bowel sounds are normal. She exhibits no distension. There is no tenderness. There is no rebound and no guarding.  Musculoskeletal: Normal range of motion. She exhibits no edema and no tenderness.  Neurological: She is alert and oriented to person, place, and time. Coordination normal.  Skin: Skin is warm and dry. No rash noted. She is not diaphoretic. No erythema. No pallor.  Psychiatric: She has a normal mood and affect. Her behavior is normal. Judgment and thought content normal.     [id number] sinus rhythm with no significant ST or T wave changes.   ASSESSMENT AND PLAN

## 2015-03-09 NOTE — Assessment & Plan Note (Signed)
The patient reports progressive exertional dyspnea over the last few months which is currently happening with minimal activities. She has no signs or symptoms of congestive heart failure and there is no evidence of fluid overload. Certainly, her symptoms could be related to underlying COPD. However, I think we have to evaluate her cardiac status as well. I recommend proceeding with a pharmacologic nuclear stress test. She reports inability to exercise on treadmill due to significant dyspnea. I also requested an echocardiogram to evaluate diastolic function and pulmonary pressure.  the patient can follow-up with [place] as needed if cardiac workup is abnormal. She is already going to establish with Dr. Alva Garnet in pulmonary.

## 2015-03-11 ENCOUNTER — Encounter
Admission: RE | Admit: 2015-03-11 | Discharge: 2015-03-11 | Disposition: A | Discharge status: Home / Self Care | Payer: Medicare Other | Source: Ambulatory Visit | Attending: Cardiovascular Disease | Admitting: Cardiovascular Disease

## 2015-03-11 DIAGNOSIS — R0602 Shortness of breath: Secondary | ICD-10-CM | POA: Insufficient documentation

## 2015-03-11 LAB — NM MYOCAR MULTI W/SPECT W/WALL MOTION / EF
CHL CUP NUCLEAR SDS: 0
CHL CUP NUCLEAR SRS: 0
CHL CUP NUCLEAR SSS: 1
CHL CUP STRESS STAGE 2 HR: 74 {beats}/min
CHL CUP STRESS STAGE 2 SPEED: 0 mph
CHL CUP STRESS STAGE 3 GRADE: 0 %
CHL CUP STRESS STAGE 3 HR: 74 {beats}/min
CHL CUP STRESS STAGE 4 GRADE: 0 %
CHL CUP STRESS STAGE 4 HR: 101 {beats}/min
CHL CUP STRESS STAGE 5 HR: 102 {beats}/min
CHL CUP STRESS STAGE 6 GRADE: 0 %
CHL CUP STRESS STAGE 6 HR: 94 {beats}/min
CSEPHR: 66 %
Estimated workload: 1 METS
LV sys vol: 13 mL
LVDIAVOL: 36 mL
Peak HR: 101 {beats}/min
Percent of predicted max HR: 65 %
Rest HR: 75 {beats}/min
Stage 1 HR: 77 {beats}/min
Stage 2 Grade: 0 %
Stage 3 Speed: 0 mph
Stage 4 Speed: 0 mph
Stage 5 DBP: 57 mmHg
Stage 5 Grade: 0 %
Stage 5 SBP: 91 mmHg
Stage 5 Speed: 0 mph
Stage 6 DBP: 55 mmHg
Stage 6 SBP: 112 mmHg
Stage 6 Speed: 0 mph
TID: 1.17

## 2015-03-11 MED ORDER — REGADENOSON 0.4 MG/5ML IV SOLN
0.4000 mg | Freq: Once | INTRAVENOUS | Status: AC
  Administered 2015-03-11: 0.4 mg via INTRAVENOUS

## 2015-03-11 MED ORDER — TECHNETIUM TC 99M SESTAMIBI - CARDIOLITE
13.2100 | Freq: Once | INTRAVENOUS | Status: AC | PRN
  Administered 2015-03-11: 09:00:00 13.21 via INTRAVENOUS

## 2015-03-11 MED ORDER — TECHNETIUM TC 99M SESTAMIBI - CARDIOLITE
31.4200 | Freq: Once | INTRAVENOUS | Status: AC | PRN
  Administered 2015-03-11: 31.42 via INTRAVENOUS

## 2015-03-15 ENCOUNTER — Institutional Professional Consult (permissible substitution): Payer: Medicare Other | Admitting: Internal Medicine

## 2015-03-21 ENCOUNTER — Encounter: Payer: Self-pay | Admitting: Pulmonary Disease

## 2015-03-21 ENCOUNTER — Ambulatory Visit (INDEPENDENT_AMBULATORY_CARE_PROVIDER_SITE_OTHER): Payer: Medicare Other | Admitting: Pulmonary Disease

## 2015-03-21 VITALS — BP 126/62 | HR 91 | Ht 62.0 in | Wt 133.0 lb

## 2015-03-21 DIAGNOSIS — J449 Chronic obstructive pulmonary disease, unspecified: Secondary | ICD-10-CM | POA: Diagnosis not present

## 2015-03-21 DIAGNOSIS — R0600 Dyspnea, unspecified: Secondary | ICD-10-CM | POA: Diagnosis not present

## 2015-03-21 DIAGNOSIS — J441 Chronic obstructive pulmonary disease with (acute) exacerbation: Secondary | ICD-10-CM

## 2015-03-21 DIAGNOSIS — J438 Other emphysema: Secondary | ICD-10-CM | POA: Diagnosis not present

## 2015-03-21 MED ORDER — FLUTICASONE FUROATE-VILANTEROL 100-25 MCG/INH IN AEPB: 1.0000 | INHALATION_SPRAY | Freq: Every day | RESPIRATORY_TRACT | Status: AC

## 2015-03-21 MED ORDER — FLUTICASONE FUROATE-VILANTEROL 100-25 MCG/INH IN AEPB: 1.0000 | INHALATION_SPRAY | Freq: Every day | RESPIRATORY_TRACT | Status: DC

## 2015-03-22 ENCOUNTER — Ambulatory Visit (INDEPENDENT_AMBULATORY_CARE_PROVIDER_SITE_OTHER): Payer: Medicare Other

## 2015-03-22 ENCOUNTER — Other Ambulatory Visit: Payer: Self-pay

## 2015-03-22 DIAGNOSIS — R0602 Shortness of breath: Secondary | ICD-10-CM | POA: Diagnosis not present

## 2015-03-23 NOTE — Progress Notes (Signed)
PULMONARY CONSULT NOTE  Requesting MD/Service: Kirkland Hun, MD Date of initial consultation: 03/21/15 Reason for consultation: Dyspnea  PT PROFILE: 65 y.o. F smoker initially evaluated 03/21/15 for baseline dyspnea, likely due to COPD with acute worsening of symptoms in the weeks prior to initial evaluation due to acute COPD exacerbation  HPI:  65 y.o. F referred by Dr Vickki Muff for evaluation of dyspnea. The patient reports baseline class 2/3 dyspnea. She developed acute worsening of her symptoms approx one month prior to this evaluation and was evaluated in the ED and underwent a CXR which was normal and CTA chest which revealed no PE (rest of report is documented below). She was treated as an exac of COPD with antibiotics and prednisone. She has improved substantially but has not fully returned to baseline with persistent increase in her level of DOE. She is presently maintained on an albuterol MDI only which she uses infrequently but notes modest benefit when she does use it. She continues to smoke up to a pack per day. She previously quit smoking for 3 yrs but relapse during a period of extreme emotional stress.   Past Medical History  Diagnosis Date  . COPD, mild [facility])     Past Surgical History  Procedure Laterality Date  . Abdominal hysterectomy    . Skin cancer excision    . Benign tumor      breast    MEDICATIONS: I have reviewed all medications and confirmed regimen as documented  Social History   Social History  . Marital Status: Married    Spouse Name: N/A  . Number of Children: N/A  . Years of Education: N/A   Occupational History  . Not on file.   Social History Main Topics  . Smoking status: Current Every Day Smoker -- 0.50 packs/day for 45 years    Types: Cigarettes    Start date: 10/19/1974  . Smokeless tobacco: Never Used  . Alcohol Use: No  . Drug Use: No  . Sexual Activity: Yes    Birth Control/ Protection: None   Other Topics Concern  . Not on file    Social History Narrative    Family History  Problem Relation Age of Onset  . Heart attack Father   . Diabetes Father   . Heart Problems Maternal Grandmother     ROS: No fever, myalgias/arthralgias, unexplained weight loss or weight gain No new focal weakness or sensory deficits No otalgia, hearing loss, visual changes, nasal and sinus symptoms, mouth and throat problems No neck pain or adenopathy No abdominal pain, N/V/D, diarrhea, change in bowel pattern No dysuria, change in urinary pattern No LE edema or calf tenderness   Filed Vitals:   03/21/15 1547  BP: 126/62  Pulse: 91  Height: 5\' 2"  (1.575 m)  Weight: 133 lb (60.328 kg)  SpO2: 95%     EXAM:  Gen: WDWN, No overt respiratory distress HEENT: NCAT, TMs and canals normal, sclera white, nares and nasal mucosa normal, oropharynx normal Neck: Supple without LAN, thyromegaly, JVD Lungs: breath sounds mildly diminshed, percussion note hyperrresonant, No wheezes Cardiovascular: Normal rate, reg rhythm, no murmurs noted Abdomen: Soft, nontender, normal BS Ext: without clubbing, cyanosis, edema Neuro: CNs grossly intact, motor and sensory intact, DTRs symmetric Skin: Limited exam, no lesions noted  DATA:   BMP Latest Ref Rng 02/18/2015  Glucose 65 - 99 mg/dL 92  BUN 6 - 20 mg/dL 12  Creatinine 0.44 - 1.00 mg/dL 0.88  Sodium 135 - 145 mmol/L 137  Potassium 3.5 - 5.1 mmol/L 3.9  Chloride 101 - 111 mmol/L 103  CO2 22 - 32 mmol/L 25  Calcium 8.9 - 10.3 mg/dL 9.2    CBC Latest Ref Rng 02/18/2015  WBC 3.6 - 11.0 K/uL 11.3(H)  Hemoglobin 12.0 - 16.0 g/dL 13.3  Hematocrit 35.0 - 47.0 % 40.6  Platelets 150 - 440 K/uL 212   CT chest report 02/18/16: 1. Negative for pulmonary embolism. 2. Mild emphysema.  3. There are a few scattered pulmonary nodules measuring up to 3 mm. Given the background emphysema, follow-up chest CT is recommended in 12 months  Myoview stress test 03/11/15:  There was no ST segment  deviation noted during stress.  The study is normal.  This is a low risk study.  The left ventricular ejection fraction is normal (55-65%).   CXR (02/18/15): NACPD. Possible mild hyperinflation    IMPRESSION:     ICD-9-CM ICD-10-CM   1. Chronic obstructive pulmonary disease, unspecified COPD type ([place]) 496 J44.9 Pulmonary function test  2. Dyspnea 786.09 R06.00   3. Other emphysema ([place]) 492.8 J43.8   4. COPD exacerbation ([facility]) 491.21 J44.1   1) Chronic dyspnea 2) Emphysema by recent CT chest 3) recent AECOPD - still has not returned to her former baseline of class 2/3 dyspnea 4) Smoker    PLAN:  1) we discussed smoking cessation in detail. I emphasized that there is nothing much that I can do to help improve her symptoms if I am working against continued smoking 2) Begin Breo 100/25 - sample provided and Rx sent 3) ROV 6 wks with full PFTs   Wilhelmina Mcardle, MD [facility]

## 2015-03-24 ENCOUNTER — Ambulatory Visit: Payer: Medicare Other | Admitting: Nurse Practitioner

## 2015-05-16 ENCOUNTER — Ambulatory Visit: Payer: Medicare Other | Admitting: Pulmonary Disease

## 2015-07-15 ENCOUNTER — Encounter: Payer: Self-pay | Admitting: Emergency Medicine

## 2015-07-15 ENCOUNTER — Ambulatory Visit
Admission: EM | Admit: 2015-07-15 | Discharge: 2015-07-15 | Disposition: A | Discharge status: Home / Self Care | Payer: Medicare Other | Attending: Internal Medicine | Admitting: Internal Medicine

## 2015-07-15 DIAGNOSIS — J069 Acute upper respiratory infection, unspecified: Secondary | ICD-10-CM | POA: Diagnosis not present

## 2015-07-15 DIAGNOSIS — J441 Chronic obstructive pulmonary disease with (acute) exacerbation: Secondary | ICD-10-CM

## 2015-07-15 MED ORDER — CETIRIZINE HCL 10 MG PO TABS: 10.0000 mg | ORAL_TABLET | Freq: Every day | ORAL | Status: DC

## 2015-07-15 MED ORDER — AZITHROMYCIN 250 MG PO TABS: 250.0000 mg | ORAL_TABLET | Freq: Every day | ORAL | Status: DC

## 2015-07-15 MED ORDER — IPRATROPIUM-ALBUTEROL 0.5-2.5 (3) MG/3ML IN SOLN
3.0000 mL | Freq: Once | RESPIRATORY_TRACT | Status: AC
  Administered 2015-07-15: 3 mL via RESPIRATORY_TRACT

## 2015-07-15 MED ORDER — PREDNISONE 10 MG PO TABS: 50.0000 mg | ORAL_TABLET | Freq: Every day | ORAL | Status: AC

## 2015-07-15 NOTE — Discharge Instructions (Signed)
Seasonal allergies--try a switch from Loratadine to Ceteirizine ---one table daily and continue until SNOW  If well tolerated Prednisone 5 tablets for 5 days to decrease the inflammation in the lungs Azithromycin is antibiotic for the infection suspected is making your breathing more difficult and the cough productive Use your inhaler during the day as needed Use your nebulizer morning and evening  Please return to care with your PCP for re-evalauation next week-- return to [place] or to the ER of your choice over the weekend if you have increased difficulty   Use your cough medicine as needed  Increase your liquids by mouth       Chronic Obstructive Pulmonary Disease Chronic obstructive pulmonary disease (COPD) is a common lung condition in which airflow from the lungs is limited. COPD is a general term that can be used to describe many different lung problems that limit airflow, including both chronic bronchitis and emphysema. If you have COPD, your lung function will probably never return to normal, but there are measures you can take to improve lung function and make yourself feel better. CAUSES   Smoking (common).  Exposure to secondhand smoke.  Genetic problems.  Chronic inflammatory lung diseases or recurrent infections. SYMPTOMS  Shortness of breath, especially with physical activity.  Deep, persistent (chronic) cough with a large amount of thick mucus.  Wheezing.  Rapid breaths (tachypnea).  Gray or bluish discoloration (cyanosis) of the skin, especially in your fingers, toes, or lips.  Fatigue.  Weight loss.  Frequent infections or episodes when breathing symptoms become much worse (exacerbations).  Chest tightness. DIAGNOSIS Your health care provider will take a medical history and perform a physical examination to diagnose COPD. Additional tests for COPD may include:  Lung (pulmonary) function tests.  Chest X-ray.  CT scan.  Blood tests. TREATMENT    Treatment for COPD may include:  Inhaler and nebulizer medicines. These help manage the symptoms of COPD and make your breathing more comfortable.  Supplemental oxygen. Supplemental oxygen is only helpful if you have a low oxygen level in your blood.  Exercise and physical activity. These are beneficial for nearly all people with COPD.  Lung surgery or transplant.  Nutrition therapy to gain weight, if you are underweight.  Pulmonary rehabilitation. This may involve working with a team of health care providers and specialists, such as respiratory, occupational, and physical therapists. HOME CARE INSTRUCTIONS  Take all medicines (inhaled or pills) as directed by your health care provider.  Avoid over-the-counter medicines or cough syrups that dry up your airway (such as antihistamines) and slow down the elimination of secretions unless instructed otherwise by your health care provider.  If you are a smoker, the most important thing that you can do is stop smoking. Continuing to smoke will cause further lung damage and breathing trouble. Ask your health care provider for help with quitting smoking. He or she can direct you to community resources or hospitals that provide support.  Avoid exposure to irritants such as smoke, chemicals, and fumes that aggravate your breathing.  Use oxygen therapy and pulmonary rehabilitation if directed by your health care provider. If you require home oxygen therapy, ask your health care provider whether you should purchase a pulse oximeter to measure your oxygen level at home.  Avoid contact with individuals who have a contagious illness.  Avoid extreme temperature and humidity changes.  Eat healthy foods. Eating smaller, more frequent meals and resting before meals may help you maintain your strength.  Stay active, but  balance activity with periods of rest. Exercise and physical activity will help you maintain your ability to do things you want to  do.  Preventing infection and hospitalization is very important when you have COPD. Make sure to receive all the vaccines your health care provider recommends, especially the pneumococcal and influenza vaccines. Ask your health care provider whether you need a pneumonia vaccine.  Learn and use relaxation techniques to manage stress.  Learn and use controlled breathing techniques as directed by your health care provider. Controlled breathing techniques include:  Pursed lip breathing. Start by breathing in (inhaling) through your nose for 1 second. Then, purse your lips as if you were going to whistle and breathe out (exhale) through the pursed lips for 2 seconds.  Diaphragmatic breathing. Start by putting one hand on your abdomen just above your waist. Inhale slowly through your nose. The hand on your abdomen should move out. Then purse your lips and exhale slowly. You should be able to feel the hand on your abdomen moving in as you exhale.  Learn and use controlled coughing to clear mucus from your lungs. Controlled coughing is a series of short, progressive coughs. The steps of controlled coughing are: 1. Lean your head slightly forward. 2. Breathe in deeply using diaphragmatic breathing. 3. Try to hold your breath for 3 seconds. 4. Keep your mouth slightly open while coughing twice. 5. Spit any mucus out into a tissue. 6. Rest and repeat the steps once or twice as needed. SEEK MEDICAL CARE IF:  You are coughing up more mucus than usual.  There is a change in the color or thickness of your mucus.  Your breathing is more labored than usual.  Your breathing is faster than usual. SEEK IMMEDIATE MEDICAL CARE IF:  You have shortness of breath while you are resting.  You have shortness of breath that prevents you from:  Being able to talk.  Performing your usual physical activities.  You have chest pain lasting longer than 5 minutes.  Your skin color is more cyanotic than  usual.  You measure low oxygen saturations for longer than 5 minutes with a pulse oximeter. MAKE SURE YOU:  Understand these instructions.  Will watch your condition.  Will get help right away if you are not doing well or get worse.   This information is not intended to replace advice given to you by your health care provider. Make sure you discuss any questions you have with your health care provider.   Document Released: 11/15/2004 Document Revised: 02/26/2014 Document Reviewed: 10/02/2012 Elsevier Interactive Patient Education 2016 Elsevier Inc. Upper Respiratory Infection, Adult Most upper respiratory infections (URIs) are caused by a virus. A URI affects the nose, throat, and upper air passages. The most common type of URI is often called "the common cold." HOME CARE   Take medicines only as told by your doctor.  Gargle warm saltwater or take cough drops to comfort your throat as told by your doctor.  Use a warm mist humidifier or inhale steam from a shower to increase air moisture. This may make it easier to breathe.  Drink enough fluid to keep your pee (urine) clear or pale yellow.  Eat soups and other clear broths.  Have a healthy diet.  Rest as needed.  Go back to work when your fever is gone or your doctor says it is okay.  You may need to stay home longer to avoid giving your URI to others.  You can also wear a  face mask and wash your hands often to prevent spread of the virus.  Use your inhaler more if you have asthma.  Do not use any tobacco products, including cigarettes, chewing tobacco, or electronic cigarettes. If you need help quitting, ask your doctor. GET HELP IF:  You are getting worse, not better.  Your symptoms are not helped by medicine.  You have chills.  You are getting more short of breath.  You have brown or red mucus.  You have yellow or brown discharge from your nose.  You have pain in your face, especially when you bend  forward.  You have a fever.  You have puffy (swollen) neck glands.  You have pain while swallowing.  You have white areas in the back of your throat. GET HELP RIGHT AWAY IF:   You have very bad or constant:  Headache.  Ear pain.  Pain in your forehead, behind your eyes, and over your cheekbones (sinus pain).  Chest pain.  You have long-lasting (chronic) lung disease and any of the following:  Wheezing.  Long-lasting cough.  Coughing up blood.  A change in your usual mucus.  You have a stiff neck.  You have changes in your:  Vision.  Hearing.  Thinking.  Mood. MAKE SURE YOU:   Understand these instructions.  Will watch your condition.  Will get help right away if you are not doing well or get worse.   This information is not intended to replace advice given to you by your health care provider. Make sure you discuss any questions you have with your health care provider.   Document Released: 07/25/2007 Document Revised: 06/22/2014 Document Reviewed: 05/13/2013 Elsevier Interactive Patient Education [employer].

## 2015-07-15 NOTE — ED Notes (Signed)
Patient c/o sinus congestion, runny nose, and cough for the past 10 days.  Patient denies fevers.

## 2015-07-15 NOTE — ED Provider Notes (Signed)
CSN: [id number]     Arrival date & time 07/15/15  1131 History   First MD Initiated Contact with Patient 07/15/15 1150     Chief Complaint  Patient presents with  . Cough   (Consider location/radiation/quality/duration/timing/severity/associated sxs/prior Treatment) HPI   65 yo F with 10 days of cough x 10 days, producing white mucous. Hx of post nasal drip, seasonal allergies. Takes Loratadine on occasion.  Presents wondering about an allergy flare. Denies fever Had episode of SOB a few weeks ago and reports 02 in the mid 80s  Had CT done at [facility] reported as mild COPD , uses Breo-ellipta. Hx of long term smoking 1/2 ppd x 45 years--was off for 10 years and started back 3 years ago at 1 ppd. Relates to family stress --family members died- one of brain infection and another of pancreatic Ca  Long standing benign essential tremor on Primidone Past Medical History  Diagnosis Date  . COPD, mild [facility])    Past Surgical History  Procedure Laterality Date  . Abdominal hysterectomy    . Skin cancer excision    . Benign tumor      breast   Family History  Problem Relation Age of Onset  . Heart attack Father   . Diabetes Father   . Heart Problems Maternal Grandmother    Social History  Substance Use Topics  . Smoking status: Current Every Day Smoker -- 0.50 packs/day for 45 years    Types: Cigarettes    Start date: 10/19/1974  . Smokeless tobacco: Never Used  . Alcohol Use: No   OB History    No data available     Review of Systems Review of 10 systems negative for acute change except as referenced in HPI  Allergies  Oxycodone; Cephalexin; Hydrocodone; and Penicillins  Home Medications   Prior to Admission medications   Medication Sig Start Date End Date Taking? Authorizing Provider  albuterol (PROVENTIL HFA) 108 (90 Base) MCG/ACT inhaler Inhale 2 puffs into the lungs every 4 (four) hours as needed for wheezing or shortness of breath. 02/18/15   Carrie Mew, MD   azithromycin (ZITHROMAX) 250 MG tablet Take 1 tablet (250 mg total) by mouth daily. Take first 2 tablets together, then 1 every day until finished. 07/15/15   Jan Fireman, PA-C  buPROPion [facility] SR) 100 MG 12 hr tablet Take 1 tablet (100 mg total) by mouth 2 (two) times daily. Patient taking differently: Take by mouth. Takes 1/2 tablet tid. 10/19/14   Marjie Skiff, MD  cetirizine (ZYRTEC) 10 MG tablet Take 1 tablet (10 mg total) by mouth daily. 07/15/15   Jan Fireman, PA-C  DULoxetine (CYMBALTA) 60 MG capsule Take 1 capsule (60 mg total) by mouth daily. 10/19/14   Marjie Skiff, MD  Fluticasone Furoate-Vilanterol (BREO ELLIPTA) 100-25 MCG/INH AEPB Inhale 1 puff into the lungs daily. 03/21/15   Wilhelmina Mcardle, MD  LORazepam (ATIVAN) 1 MG tablet Take 1 tablet (1 mg total) by mouth 2 (two) times daily. Patient taking differently: Take 1 mg by mouth daily.  10/19/14   Marjie Skiff, MD  omeprazole (PRILOSEC) 40 MG capsule Take by mouth.    Historical Provider, MD  primidone (MYSOLINE) 50 MG tablet Take 50 mg by mouth 2 (two) times daily.  05/04/14 05/04/15  Historical Provider, MD  traZODone (DESYREL) 100 MG tablet Take 1.5 tablets (150 mg total) by mouth at bedtime as needed for sleep. Patient taking differently: Take 100 mg by mouth  at bedtime as needed for sleep.  10/19/14   Marjie Skiff, MD   Meds Ordered and Administered this Visit   Medications  ipratropium-albuterol (DUONEB) 0.5-2.5 (3) MG/3ML nebulizer solution 3 mL (3 mLs Nebulization Given 07/15/15 1225)  very well tolerated-patient felt significant improvement and ease of breathing , cough subsided- very pleased    BP 103/60 mmHg  Pulse 77  Temp(Src) 98.9 F (37.2 C) (Tympanic)  Resp 18  Ht 5\' 2"  (1.575 m)  Wt 128 lb (58.06 kg)  BMI 23.41 kg/m2  SpO2 94%  LMP  (LMP Unknown) No data found.   Physical Exam   Constitutional -alert and oriented,well appearing but experiencing cough,wheeze General: Mild  distress Head-atraumatic, normocephalic Eyes- conjunctiva normal, EOMI ,conjugate gaze Ears: grossly normal hearing Nose- no congestion or rhinorrhea Mouth/throat- mucous membranes moist ,oropharynx non-erythematous Neck- supple without glandular enlargement CV- regular rate 91 and rhythmn Resp-no distress, normal respiratory effort,no intercostals;  + wheeze bilaterally GI- soft,non-tender,no distention GU-  not examined MSK- non tender, normal ROM, ambulatory, no gait instability,on /off table solo Neuro- normal speech and language, fine benign essential tremor Skin-warm,dry ,intact;  Psych-mood and affect grossly normal; speech and behavior grossly normal  ED Course  Procedures (including critical care time)  Labs Review Labs Reviewed - No data to display  Imaging Review No results found.  MDM   1. COPD exacerbation ([place])   2. URI (upper respiratory infection)    Diagnosis and treatment discussed.-handout given   Questions fielded, expectations and recommendations reviewed. Discussed follow up and return parameters including no resolution or any worsening condition.  Patient expresses understanding  and agrees to plan.  Will return to [facility] with questions, concerns or exacerbation.   Discharge Medication List as of 07/15/2015  1:21 PM    START taking these medications   Details  azithromycin (ZITHROMAX) 250 MG tablet Take 1 tablet (250 mg total) by mouth daily. Take first 2 tablets together, then 1 every day until finished., Starting 07/15/2015, Until Discontinued, Print    predniSONE (DELTASONE) 10 MG tablet Take 5 tablets (50 mg total) by mouth daily., Starting 07/15/2015, Until Wed 07/20/15, Normal        Jan Fireman, PA-C 07/22/15 0010

## 2015-07-22 ENCOUNTER — Encounter: Payer: Self-pay | Admitting: Physician Assistant

## 2015-10-17 ENCOUNTER — Encounter: Payer: Self-pay | Admitting: *Deleted

## 2015-10-18 ENCOUNTER — Encounter
Admission: RE | Disposition: A | Discharge status: Home Health | Payer: Self-pay | Source: Ambulatory Visit | Attending: Gastroenterology

## 2015-10-18 ENCOUNTER — Ambulatory Visit: Payer: Medicare Other | Admitting: Anesthesiology

## 2015-10-18 ENCOUNTER — Encounter: Payer: Self-pay | Admitting: *Deleted

## 2015-10-18 ENCOUNTER — Ambulatory Visit
Admission: RE | Admit: 2015-10-18 | Discharge: 2015-10-18 | Disposition: A | Discharge status: Home Health | Payer: Medicare Other | Source: Ambulatory Visit | Attending: Gastroenterology | Admitting: Gastroenterology

## 2015-10-18 DIAGNOSIS — J449 Chronic obstructive pulmonary disease, unspecified: Secondary | ICD-10-CM | POA: Insufficient documentation

## 2015-10-18 DIAGNOSIS — K621 Rectal polyp: Secondary | ICD-10-CM | POA: Diagnosis not present

## 2015-10-18 DIAGNOSIS — D125 Benign neoplasm of sigmoid colon: Secondary | ICD-10-CM | POA: Diagnosis not present

## 2015-10-18 DIAGNOSIS — K5730 Diverticulosis of large intestine without perforation or abscess without bleeding: Secondary | ICD-10-CM | POA: Insufficient documentation

## 2015-10-18 DIAGNOSIS — K219 Gastro-esophageal reflux disease without esophagitis: Secondary | ICD-10-CM | POA: Diagnosis not present

## 2015-10-18 DIAGNOSIS — K6289 Other specified diseases of anus and rectum: Secondary | ICD-10-CM | POA: Diagnosis not present

## 2015-10-18 DIAGNOSIS — E785 Hyperlipidemia, unspecified: Secondary | ICD-10-CM | POA: Diagnosis not present

## 2015-10-18 HISTORY — DX: Palpitations: R00.2

## 2015-10-18 HISTORY — DX: Dysphagia, unspecified: R13.10

## 2015-10-18 HISTORY — DX: Esophageal web: Q39.4

## 2015-10-18 HISTORY — DX: Cough, unspecified: R05.9

## 2015-10-18 HISTORY — DX: Allergy, unspecified, initial encounter: T78.40XA

## 2015-10-18 HISTORY — DX: Insomnia, unspecified: G47.00

## 2015-10-18 HISTORY — DX: Adjustment disorder with depressed mood: F43.21

## 2015-10-18 HISTORY — DX: Depression, unspecified: F32.A

## 2015-10-18 HISTORY — DX: Gastro-esophageal reflux disease without esophagitis: K21.9

## 2015-10-18 HISTORY — DX: Anxiety disorder, unspecified: F41.9

## 2015-10-18 HISTORY — DX: Other specified postprocedural states: Z98.890

## 2015-10-18 HISTORY — DX: Cough: R05

## 2015-10-18 HISTORY — DX: Tobacco use: Z72.0

## 2015-10-18 HISTORY — DX: Disappearance and death of family member: Z63.4

## 2015-10-18 HISTORY — DX: Restless legs syndrome: G25.81

## 2015-10-18 HISTORY — PX: COLONOSCOPY WITH PROPOFOL: SHX5780

## 2015-10-18 HISTORY — DX: Unspecified rotator cuff tear or rupture of unspecified shoulder, not specified as traumatic: M75.100

## 2015-10-18 HISTORY — DX: Eosinophilic esophagitis: K20.0

## 2015-10-18 HISTORY — DX: Major depressive disorder, single episode, unspecified: F32.9

## 2015-10-18 HISTORY — DX: Hyperlipidemia, unspecified: E78.5

## 2015-10-18 HISTORY — DX: Dorsalgia, unspecified: M54.9

## 2015-10-18 SURGERY — COLONOSCOPY WITH PROPOFOL
Anesthesia: General

## 2015-10-18 MED ORDER — FENTANYL CITRATE (PF) 100 MCG/2ML IJ SOLN
INTRAMUSCULAR | Status: DC | PRN
  Administered 2015-10-18: 50 ug via INTRAVENOUS

## 2015-10-18 MED ORDER — PROPOFOL 10 MG/ML IV BOLUS
INTRAVENOUS | Status: DC | PRN
  Administered 2015-10-18: 100 mg via INTRAVENOUS

## 2015-10-18 MED ORDER — LIDOCAINE 2% (20 MG/ML) 5 ML SYRINGE
INTRAMUSCULAR | Status: DC | PRN
  Administered 2015-10-18: 40 mg via INTRAVENOUS

## 2015-10-18 MED ORDER — PHENYLEPHRINE HCL 10 MG/ML IJ SOLN
INTRAMUSCULAR | Status: DC | PRN
  Administered 2015-10-18: 100 ug via INTRAVENOUS

## 2015-10-18 MED ORDER — SODIUM CHLORIDE 0.9 % IV SOLN: INTRAVENOUS | Status: DC

## 2015-10-18 MED ORDER — MIDAZOLAM HCL 5 MG/5ML IJ SOLN
INTRAMUSCULAR | Status: DC | PRN
  Administered 2015-10-18: 1 mg via INTRAVENOUS

## 2015-10-18 MED ORDER — PROPOFOL 500 MG/50ML IV EMUL
INTRAVENOUS | Status: DC | PRN
Start: 2015-10-18 — End: 2015-10-18
  Administered 2015-10-18: 120 ug/kg/min via INTRAVENOUS

## 2015-10-18 MED ORDER — SODIUM CHLORIDE 0.9 % IV SOLN
INTRAVENOUS | Status: DC
  Administered 2015-10-18: 1000 mL via INTRAVENOUS

## 2015-10-18 NOTE — H&P (Signed)
Outpatient short stay form Pre-procedure 10/18/2015 2:13 PM Lollie Sails MD  Primary Physician: Dr. Kirkland Hun  Reason for visit:  Colonoscopy  History of present illness:  Patient is a 65 year old female presenting today for colonoscopy. She has a history of recent CT scan done for abdominal pain that found a atypical area and sigmoid colon. There is a demonstrated mild circumferential sigmoid wall thickening chesting infectious or inflammatory issue. She did undergo course of Levaquin and metronidazole. She has had a colonoscopy in the past about 10 years ago. Results uncertain. There is no family history of colon cancer. She tolerated her prep well. She does take Excedrin regularly however states she has not had any for about 5 days.    Current Facility-Administered Medications:  .  0.9 %  sodium chloride infusion, , Intravenous, Continuous, Lollie Sails, MD, Last Rate: 20 mL/hr at 10/18/15 1354, 1,000 mL at 10/18/15 1354 .  0.9 %  sodium chloride infusion, , Intravenous, Continuous, Lollie Sails, MD  Prescriptions Prior to Admission  Medication Sig Dispense Refill Last Dose  . albuterol (PROVENTIL HFA) 108 (90 Base) MCG/ACT inhaler Inhale 2 puffs into the lungs every 4 (four) hours as needed for wheezing or shortness of breath. 1 Inhaler 0 Past Month at Unknown time  . LORazepam (ATIVAN) 1 MG tablet Take 1 tablet (1 mg total) by mouth 2 (two) times daily. (Patient taking differently: Take 1 mg by mouth daily. ) 60 tablet 3 10/17/2015 at Unknown time  . omeprazole (PRILOSEC) 40 MG capsule Take by mouth.   10/17/2015 at Unknown time  . traZODone (DESYREL) 100 MG tablet Take 1.5 tablets (150 mg total) by mouth at bedtime as needed for sleep. (Patient taking differently: Take 100 mg by mouth at bedtime as needed for sleep. ) 45 tablet 3 10/17/2015 at Unknown time  . azithromycin (ZITHROMAX) 250 MG tablet Take 1 tablet (250 mg total) by mouth daily. Take first 2 tablets together,  then 1 every day until finished. (Patient not taking: Reported on 10/18/2015) 6 tablet 0 Completed Course at Unknown time  . buPROPion (WELLBUTRIN SR) 100 MG 12 hr tablet Take 1 tablet (100 mg total) by mouth 2 (two) times daily. (Patient not taking: Reported on 10/18/2015) 60 tablet 3 Completed Course at Unknown time  . cetirizine (ZYRTEC) 10 MG tablet Take 1 tablet (10 mg total) by mouth daily. 30 tablet 0   . DULoxetine (CYMBALTA) 60 MG capsule Take 1 capsule (60 mg total) by mouth daily. (Patient not taking: Reported on 10/18/2015) 30 capsule 3 Completed Course at Unknown time  . Fluticasone Furoate-Vilanterol (BREO ELLIPTA) 100-25 MCG/INH AEPB Inhale 1 puff into the lungs daily. (Patient not taking: Reported on 10/18/2015) 1 each 11 Completed Course at Unknown time  . primidone (MYSOLINE) 50 MG tablet Take 50 mg by mouth 2 (two) times daily.    Taking     Allergies  Allergen Reactions  . Oxycodone Itching  . Cephalexin Swelling  . Hydrocodone Other (See Comments)  . Penicillins Other (See Comments)    Other Reaction: OTHER REACTION BLEEDING THRU S     Past Medical History:  Diagnosis Date  . Allergic state   . Anxiety   . Back pain   . COPD, mild ([place])   . Cough   . Depression   . Dysphagia   . Eosinophilic esophagitis   . Esophageal web   . GERD (gastroesophageal reflux disease)   . Grief at loss of child   .  H/O shoulder surgery   . Hyperlipidemia   . Insomnia   . Palpitations   . RLS (restless legs syndrome)   . Rotator cuff tear - Right   . Tobacco use     Review of systems:      Physical Exam    Heart and lungs: Regular rate and rhythm without rub or gallop, lungs are bilaterally clear.    HEENT: Normocephalic atraumatic eyes are anicteric    Other:     Pertinant exam for procedure: Soft nontender nondistended bowel sounds positive normoactive. There are no masses or rebound.    Planned proceedures: Colonoscopy and indicated procedures. I have discussed  the risks benefits and complications of procedures to include not limited to bleeding, infection, perforation and the risk of sedation and the patient wishes to proceed.    Lollie Sails, MD Gastroenterology 10/18/2015  2:13 PM

## 2015-10-18 NOTE — Op Note (Signed)
[facility] Patient Name: Laura Bates Procedure Date: 10/18/2015 2:15 PM MRN: [id number] Account #: [id number] Date of Birth: December 29, 1950 Admit Type: Outpatient Age: 65 Room: [facility] ENDO ROOM 3 Gender: Female Note Status: Finalized Procedure:            Colonoscopy Indications:          Abnormal CT of the GI tract Providers:            Lollie Sails, MD Referring MD:         Bo Mcclintock. Vickki Muff (Referring MD) Medicines:            Monitored Anesthesia Care Complications:        No immediate complications. Procedure:            Pre-Anesthesia Assessment:                       - ASA Grade Assessment: III - A patient with severe                        systemic disease.                       After obtaining informed consent, the colonoscope was                        passed under direct vision. Throughout the procedure,                        the patient's blood pressure, pulse, and oxygen                        saturations were monitored continuously. The                        Colonoscope was introduced through the anus and                        advanced to the the cecum, identified by appendiceal                        orifice and ileocecal valve. The colonoscopy was                        performed without difficulty. The patient tolerated the                        procedure well. The quality of the bowel preparation                        was good. Findings:      A 3 mm polyp was found in the sigmoid colon. The polyp was sessile. The       polyp was removed with a cold biopsy forceps. Resection and retrieval       were complete.      Five sessile polyps were found in the rectum. The polyps were 1 to 2 mm       in size. These polyps were removed with a cold biopsy forceps. Resection       and retrieval were complete.      Multiple small-mouthed diverticula were found in the sigmoid colon and  distal descending colon.      Biopsies for  histology were taken with a cold forceps from the right       colon and left colon for evaluation of microscopic colitis.      The digital rectal exam findings include decreased sphincter tone.      The digital rectal exam was otherwise normal.      The retroflexed view of the distal rectum and anal verge was normal and       showed no anal or rectal abnormalities. Impression:           - One 3 mm polyp in the sigmoid colon, removed with a                        cold biopsy forceps. Resected and retrieved.                       - Five 1 to 2 mm polyps in the rectum, removed with a                        cold biopsy forceps. Resected and retrieved.                       - Diverticulosis in the sigmoid colon and in the distal                        descending colon.                       - Decreased sphincter tone found on digital rectal exam.                       - The distal rectum and anal verge are normal on                        retroflexion view.                       - Biopsies were taken with a cold forceps from the                        right colon and left colon for evaluation of                        microscopic colitis. Recommendation:       - Discharge patient to home.                       - Await pathology results. Procedure Code(s):    --- Professional ---                       [id number], Colonoscopy, flexible; with biopsy, single or                        multiple Diagnosis Code(s):    --- Professional ---                       D12.5, Benign neoplasm of sigmoid colon                       K62.1, Rectal  polyp                       K62.89, Other specified diseases of anus and rectum                       K57.30, Diverticulosis of large intestine without                        perforation or abscess without bleeding                       R93.3, Abnormal findings on diagnostic imaging of other                        parts of digestive tract CPT copyright 2016 American  Medical Association. All rights reserved. The codes documented in this report are preliminary and upon coder review may  be revised to meet current compliance requirements. Lollie Sails, MD 10/18/2015 2:51:54 PM This report has been signed electronically. Number of Addenda: 0 Note Initiated On: 10/18/2015 2:15 PM Scope Withdrawal Time: 0 hours 12 minutes 19 seconds  Total Procedure Duration: 0 hours 24 minutes 12 seconds       [facility]

## 2015-10-18 NOTE — Anesthesia Preprocedure Evaluation (Signed)
Anesthesia Evaluation  Patient identified by MRN, date of birth, ID band Patient awake    Reviewed: Allergy & Precautions, NPO status , Patient's Chart, lab work & pertinent test results  Airway Mallampati: II       Dental   Pulmonary shortness of breath and with exertion, COPD,  COPD inhaler, Current Smoker,    Pulmonary exam normal        Cardiovascular negative cardio ROS Normal cardiovascular exam     Neuro/Psych PSYCHIATRIC DISORDERS Anxiety Depression negative neurological ROS     GI/Hepatic Neg liver ROS, GERD  Medicated and Controlled,Hx of esophageal web Esophagitis Hx dysphagia   Endo/Other  negative endocrine ROS  Renal/GU negative Renal ROS  negative genitourinary   Musculoskeletal negative musculoskeletal ROS (+)   Abdominal Normal abdominal exam  (+)   Peds negative pediatric ROS (+)  Hematology negative hematology ROS (+)   Anesthesia Other Findings   Reproductive/Obstetrics                             Anesthesia Physical Anesthesia Plan  ASA: III  Anesthesia Plan: General   Post-op Pain Management:    Induction: Intravenous  Airway Management Planned: Nasal Cannula  Additional Equipment:   Intra-op Plan:   Post-operative Plan:   Informed Consent: I have reviewed the patients History and Physical, chart, labs and discussed the procedure including the risks, benefits and alternatives for the proposed anesthesia with the patient or authorized representative who has indicated his/her understanding and acceptance.   Dental advisory given  Plan Discussed with: CRNA and Surgeon  Anesthesia Plan Comments:         Anesthesia Quick Evaluation

## 2015-10-18 NOTE — Transfer of Care (Signed)
Immediate Anesthesia Transfer of Care Note  Patient: Laura Bates  Procedure(s) Performed: Procedure(s): COLONOSCOPY WITH PROPOFOL (N/A)  Patient Location: PACU and Endoscopy Unit  Anesthesia Type:General  Level of Consciousness: awake, oriented and patient cooperative  Airway & Oxygen Therapy: Patient Spontanous Breathing and Patient connected to nasal cannula oxygen  Post-op Assessment: Report given to RN and Post -op Vital signs reviewed and stable  Post vital signs: Reviewed and stable  Last Vitals:  Vitals:   10/18/15 1327  BP: 110/88  Pulse: 75  Resp: 18  Temp: 36.7 C    Last Pain:  Vitals:   10/18/15 1327  TempSrc: Tympanic         Complications: No apparent anesthesia complications and Patient re-intubated

## 2015-10-18 NOTE — Anesthesia Postprocedure Evaluation (Signed)
Anesthesia Post Note  Patient: Laura Bates  Procedure(s) Performed: Procedure(s) (LRB): COLONOSCOPY WITH PROPOFOL (N/A)  Patient location during evaluation: PACU Anesthesia Type: General Level of consciousness: awake and alert and oriented Pain management: pain level controlled Vital Signs Assessment: post-procedure vital signs reviewed and stable Respiratory status: spontaneous breathing Cardiovascular status: blood pressure returned to baseline Anesthetic complications: no    Last Vitals:  Vitals:   10/18/15 1510 10/18/15 1514  BP: (!) 108/95 110/83  Pulse:    Resp: 13 16  Temp:      Last Pain:  Vitals:   10/18/15 1453  TempSrc: Tympanic                 Gerrianne Aydelott

## 2015-10-19 ENCOUNTER — Encounter: Payer: Self-pay | Admitting: Gastroenterology

## 2015-10-20 LAB — SURGICAL PATHOLOGY

## 2016-11-15 ENCOUNTER — Ambulatory Visit
Admission: EM | Admit: 2016-11-15 | Discharge: 2016-11-15 | Disposition: A | Discharge status: Home / Self Care | Payer: Medicare Other | Attending: Family Medicine | Admitting: Family Medicine

## 2016-11-15 DIAGNOSIS — J22 Unspecified acute lower respiratory infection: Secondary | ICD-10-CM | POA: Diagnosis not present

## 2016-11-15 DIAGNOSIS — R300 Dysuria: Secondary | ICD-10-CM

## 2016-11-15 DIAGNOSIS — J209 Acute bronchitis, unspecified: Secondary | ICD-10-CM | POA: Diagnosis not present

## 2016-11-15 LAB — URINALYSIS, COMPLETE (UACMP) WITH MICROSCOPIC
BILIRUBIN URINE: NEGATIVE
Bacteria, UA: NONE SEEN
GLUCOSE, UA: NEGATIVE mg/dL
HGB URINE DIPSTICK: NEGATIVE
KETONES UR: NEGATIVE mg/dL
Leukocytes, UA: NEGATIVE
NITRITE: NEGATIVE
PROTEIN: NEGATIVE mg/dL
Specific Gravity, Urine: 1.015 (ref 1.005–1.030)
pH: 6 (ref 5.0–8.0)

## 2016-11-15 MED ORDER — ALBUTEROL SULFATE HFA 108 (90 BASE) MCG/ACT IN AERS: 2.0000 | INHALATION_SPRAY | RESPIRATORY_TRACT | 0 refills | Status: AC | PRN

## 2016-11-15 MED ORDER — AZITHROMYCIN 500 MG PO TABS: 500.0000 mg | ORAL_TABLET | Freq: Every day | ORAL | 0 refills | Status: AC

## 2016-11-15 MED ORDER — ALBUTEROL SULFATE HFA 108 (90 BASE) MCG/ACT IN AERS
2.0000 | INHALATION_SPRAY | RESPIRATORY_TRACT | 0 refills | Status: DC | PRN
Start: 2016-11-15 — End: 2016-11-15

## 2016-11-15 MED ORDER — BENZONATATE 200 MG PO CAPS: 200.0000 mg | ORAL_CAPSULE | Freq: Three times a day (TID) | ORAL | 0 refills | Status: DC | PRN

## 2016-11-15 MED ORDER — METHYLPREDNISOLONE SODIUM SUCC 125 MG IJ SOLR
125.0000 mg | Freq: Once | INTRAMUSCULAR | Status: AC
  Administered 2016-11-15: 125 mg via INTRAMUSCULAR

## 2016-11-15 MED ORDER — PHENAZOPYRIDINE HCL 200 MG PO TABS: 200.0000 mg | ORAL_TABLET | Freq: Three times a day (TID) | ORAL | 0 refills | Status: DC | PRN

## 2016-11-15 MED ORDER — AZITHROMYCIN 500 MG PO TABS: 500.0000 mg | ORAL_TABLET | Freq: Every day | ORAL | 0 refills | Status: DC

## 2016-11-15 MED ORDER — ALBUTEROL SULFATE HFA 108 (90 BASE) MCG/ACT IN AERS: 2.0000 | INHALATION_SPRAY | RESPIRATORY_TRACT | 0 refills | Status: DC | PRN

## 2016-11-15 MED ORDER — PHENAZOPYRIDINE HCL 200 MG PO TABS
200.0000 mg | ORAL_TABLET | Freq: Three times a day (TID) | ORAL | 0 refills | Status: DC | PRN
Start: 2016-11-15 — End: 2016-11-15

## 2016-11-15 MED ORDER — PREDNISONE 10 MG (21) PO TBPK: ORAL_TABLET | ORAL | 0 refills | Status: DC

## 2016-11-15 NOTE — ED Triage Notes (Addendum)
Pt states she started out with a cold a week ago and now has "moved into my chest". Productive of white sputum. Denies pain other than her chronic pain in her spine. Also states she thinks she may have a UTI and having "stinging" when she urinates.

## 2016-11-15 NOTE — ED Provider Notes (Signed)
[facility]    CSN: [id number] Arrival date & time: 11/15/16  1445     History   Chief Complaint Chief Complaint  Patient presents with  . Cough    HPI Laura Bates is a 66 y.o. female.   Patient is a 66 year old white female who comes in because of coughing and congestion. She's also had bronchospasm and wheezing. She reports shortness of breath she states does still smoke. She reports coughing yellowish-green phlegm at times. No fever she also reports burning with urination and frequency and aspirin going on but not as long but for several days. She is a new neuropathic disease and illness causes pain chronic pain. She has hyperlipidemia insomnia palpitation restless leg syndrome rotator cuff tear on the right. She's had abdominal hysterectomy breast lumpectomy colonoscopy endoscopy and right rotator cuff surgery in the past. No pertinent family medical history relevant to today's visit. She is allergic to penicillin and cephalosporin hydrocodone and oxycodone as well   The history is provided by the patient. No language interpreter was used.  Cough  Cough characteristics:  Productive Sputum characteristics:  Green Severity:  Moderate Duration:  10 days Timing:  Constant Progression:  Worsening Chronicity:  New Context: upper respiratory infection   Relieved by:  Nothing Worsened by:  Deep breathing Ineffective treatments:  Beta-agonist inhaler Associated symptoms: shortness of breath and wheezing   Risk factors: recent infection     Past Medical History:  Diagnosis Date  . Allergic state   . Anxiety   . Back pain   . COPD, mild (Deport)   . Cough   . Depression   . Dysphagia   . Eosinophilic esophagitis   . Esophageal web   . GERD (gastroesophageal reflux disease)   . Grief at loss of child   . H/O shoulder surgery   . Hyperlipidemia   . Insomnia   . Palpitations   . RLS (restless legs syndrome)   . Rotator cuff tear - Right   . Tobacco use      Patient Active Problem List   Diagnosis Date Noted  . Exertional dyspnea 03/09/2015  . Tobacco use 03/09/2015  . H/O shoulder surgery 10/14/2014  . Allergic state 08/16/2014  . Rupture long head biceps tendon 07/22/2014  . Sprain of right rotator cuff capsule 07/22/2014  . Can't get food down 01/27/2013  . Abnormal finding on GI tract imaging 01/27/2013  . Cough 07/19/2011  . H/O elevated lipids 07/19/2011  . Awareness of heartbeats 07/19/2011  . Aggrieved 06/07/2011  . Clinical depression 06/06/2011  . Acid reflux 11/27/2010  . Anxiety 11/27/2010  . Back ache 11/27/2010  . EE (eosinophilic esophagitis) 19/62/2297  . 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 History of tobacco use 11/27/2010  . Cannot sleep 11/27/2010  . Restless leg 11/27/2010    Past Surgical History:  Procedure Laterality Date  . ABDOMINAL HYSTERECTOMY    . benign tumor     breast  . BREAST LUMPECTOMY    . COLONOSCOPY WITH PROPOFOL N/A 10/18/2015   Procedure: COLONOSCOPY WITH PROPOFOL;  Surgeon: Lollie Sails, MD;  Location: [facility] ENDOSCOPY;  Service: Endoscopy;  Laterality: N/A;  . ESOPHAGOGASTRODUODENOSCOPY    . Right Rotator Cuff Repair Right   . SKIN CANCER EXCISION      OB History    No data available       Home Medications    Prior to Admission medications   Medication Sig Start Date End Date Taking? Authorizing Provider  buprenorphine (BUTRANS) 10 MCG/HR PTWK patch Place 10 mcg onto the skin once a week.   Yes [provider]  HYDROmorphone (DILAUDID) 2 MG tablet Take by mouth every 4 (four) hours as needed for severe pain.   Yes [provider]  primidone (MYSOLINE) 250 MG tablet Take 250 mg by mouth 4 (four) times daily.   Yes [provider]  rosuvastatin (CRESTOR) 10 MG tablet Take 10 mg by mouth daily.   Yes [provider]  albuterol (PROVENTIL HFA) 108 (90 Base) MCG/ACT inhaler Inhale 2 puffs into the lungs every 4 (four) hours as needed for  wheezing or shortness of breath. 02/18/15   Carrie Mew, MD  albuterol (PROVENTIL HFA;VENTOLIN HFA) 108 (90 Base) MCG/ACT inhaler Inhale 2 puffs into the lungs every 4 (four) hours as needed. 11/15/16   Frederich Cha, MD  azithromycin (ZITHROMAX) 250 MG tablet Take 1 tablet (250 mg total) by mouth daily. Take first 2 tablets together, then 1 every day until finished. Patient not taking: Reported on 10/18/2015 07/15/15   Jan Fireman, PA-C  azithromycin (ZITHROMAX) 500 MG tablet Take 1 tablet (500 mg total) by mouth daily. 11/15/16 11/20/16  Frederich Cha, MD  benzonatate (TESSALON) 200 MG capsule Take 1 capsule (200 mg total) by mouth 3 (three) times daily as needed. 11/15/16   Frederich Cha, MD  buPROPion (WELLBUTRIN SR) 100 MG 12 hr tablet Take 1 tablet (100 mg total) by mouth 2 (two) times daily. Patient not taking: Reported on 10/18/2015 10/19/14   Marjie Skiff, MD  cetirizine (ZYRTEC) 10 MG tablet Take 1 tablet (10 mg total) by mouth daily. 07/15/15   Jan Fireman, PA-C  DULoxetine (CYMBALTA) 60 MG capsule Take 1 capsule (60 mg total) by mouth daily. Patient not taking: Reported on 10/18/2015 10/19/14   Marjie Skiff, MD  Fluticasone Furoate-Vilanterol (BREO ELLIPTA) 100-25 MCG/INH AEPB Inhale 1 puff into the lungs daily. Patient not taking: Reported on 10/18/2015 03/21/15   Wilhelmina Mcardle, MD  LORazepam (ATIVAN) 1 MG tablet Take 1 tablet (1 mg total) by mouth 2 (two) times daily. Patient taking differently: Take 1 mg by mouth daily.  10/19/14   Marjie Skiff, MD  omeprazole (PRILOSEC) 40 MG capsule Take by mouth.    [provider]  phenazopyridine (PYRIDIUM) 200 MG tablet Take 1 tablet (200 mg total) by mouth 3 (three) times daily as needed for pain. 11/15/16   Frederich Cha, MD  predniSONE (STERAPRED UNI-PAK 21 TAB) 10 MG (21) TBPK tablet Sig 6 tablet day 1, 5 tablets day 2, 4 tablets day 3,,3tablets day 4, 2 tablets day 5, 1 tablet day 6 take all tablets orally 11/15/16   Frederich Cha, MD  primidone (MYSOLINE) 50 MG tablet Take 50 mg by mouth 2 (two) times daily.  05/04/14 05/04/15  [provider]  traZODone (DESYREL) 100 MG tablet Take 1.5 tablets (150 mg total) by mouth at bedtime as needed for sleep. Patient taking differently: Take 100 mg by mouth at bedtime as needed for sleep.  10/19/14   Marjie Skiff, MD    Family History Family History  Problem Relation Age of Onset  . Heart attack Father   . Diabetes Father   . Heart Problems Maternal Grandmother     Social History Social History  Substance Use Topics  . Smoking status: Current Every Day Smoker    Packs/day: 1.00    Years: 45.00    Types: Cigarettes    Start  date: 10/19/1974  . Smokeless tobacco: Never Used  . Alcohol use No     Allergies   Oxycodone; Cephalexin; Hydrocodone; and Penicillins   Review of Systems Review of Systems  Respiratory: Positive for cough, shortness of breath and wheezing.   Genitourinary: Positive for dysuria and urgency.  All other systems reviewed and are negative.    Physical Exam Triage Vital Signs ED Triage Vitals  Enc Vitals Group     BP 11/15/16 1456 (!) 122/59     Pulse Rate 11/15/16 1456 81     Resp 11/15/16 1456 20     Temp 11/15/16 1456 98.4 F (36.9 C)     Temp Source 11/15/16 1456 Oral     SpO2 11/15/16 1456 95 %     Weight 11/15/16 1454 120 lb (54.4 kg)     Height 11/15/16 1454 5\' 2"  (1.575 m)     Head Circumference --      Peak Flow --      Pain Score --      Pain Loc --      Pain Edu? --      Excl. in [place]? --    No data found.   Updated Vital Signs BP (!) 122/59   Pulse 81   Temp 98.4 F (36.9 C) (Oral)   Resp 20   Ht 5\' 2"  (1.575 m)   Wt 120 lb (54.4 kg)   LMP  (LMP Unknown)   SpO2 95%   BMI 21.95 kg/m   Visual Acuity Right Eye Distance:   Left Eye Distance:   Bilateral Distance:    Right Eye Near:   Left Eye Near:    Bilateral Near:     Physical Exam  Constitutional: She is oriented to person,  place, and time. She appears well-developed. She has a sickly appearance.  White female does look older than stated age.  HENT:  Head: Normocephalic and atraumatic.  Right Ear: External ear normal.  Left Ear: External ear normal.  Eyes: Pupils are equal, round, and reactive to light.  Neck: Trachea normal, normal range of motion and full passive range of motion without pain. Neck supple.  Cardiovascular: Regular rhythm.  Exam reveals distant heart sounds.   Pulmonary/Chest: She has wheezes.  Abdominal: Soft.  Musculoskeletal: Normal range of motion.  Neurological: She is alert and oriented to person, place, and time.  Skin: Skin is warm and dry.  Psychiatric: She has a normal mood and affect.  Vitals reviewed.    UC Treatments / Results  Labs (all labs ordered are listed, but only abnormal results are displayed) Labs Reviewed  URINALYSIS, COMPLETE (UACMP) WITH MICROSCOPIC - Abnormal; Notable for the following:       Result Value   Squamous Epithelial / LPF 0-5 (*)    All other components within normal limits  URINE CULTURE    EKG  EKG Interpretation None       Radiology No results found.  Procedures Procedures (including critical care time)  Medications Ordered in UC Medications  methylPREDNISolone sodium succinate (SOLU-MEDROL) 125 mg/2 mL injection 125 mg (125 mg Intramuscular Given 11/15/16 1535)     Initial Impression / Assessment and Plan / UC Course  I have reviewed the triage vital signs and the nursing notes.  Pertinent labs & imaging results that were available during my care of the patient were reviewed by me and considered in my medical decision making (see chart for details).     Will Mr. Solu-Medrol 125  IM place on albuterol inhaler Zithromax Tessalon Perles and Pyridium for the urine will also place a 60 course of prednisone.  Final Clinical Impressions(s) / UC Diagnoses   Final diagnoses:  Acute lower respiratory infection  Bronchospasm with  bronchitis, acute  Dysuria    New Prescriptions New Prescriptions   ALBUTEROL (PROVENTIL HFA;VENTOLIN HFA) 108 (90 BASE) MCG/ACT INHALER    Inhale 2 puffs into the lungs every 4 (four) hours as needed.   AZITHROMYCIN (ZITHROMAX) 500 MG TABLET    Take 1 tablet (500 mg total) by mouth daily.   BENZONATATE (TESSALON) 200 MG CAPSULE    Take 1 capsule (200 mg total) by mouth 3 (three) times daily as needed.   PHENAZOPYRIDINE (PYRIDIUM) 200 MG TABLET    Take 1 tablet (200 mg total) by mouth 3 (three) times daily as needed for pain.   PREDNISONE (STERAPRED UNI-PAK 21 TAB) 10 MG (21) TBPK TABLET    Sig 6 tablet day 1, 5 tablets day 2, 4 tablets day 3,,3tablets day 4, 2 tablets day 5, 1 tablet day 6 take all tablets orally   Note: This dictation was prepared with Dragon dictation along with smaller phrase technology. Any transcriptional errors that result from this process are unintentional.  Controlled Substance Prescriptions  Controlled Substance Registry consulted? Not Applicable   Frederich Cha, MD 11/15/16 [phone]

## 2016-11-17 LAB — URINE CULTURE: SPECIAL REQUESTS: NORMAL

## 2017-08-26 IMAGING — CR DG CHEST 2V
2 series · 2 of 2 positions shown · non-contrast
Comparison: 06/11/2014

CLINICAL DATA: Chest pain and shortness of Breath

EXAM:
CHEST - 2 VIEW

[chest pa]
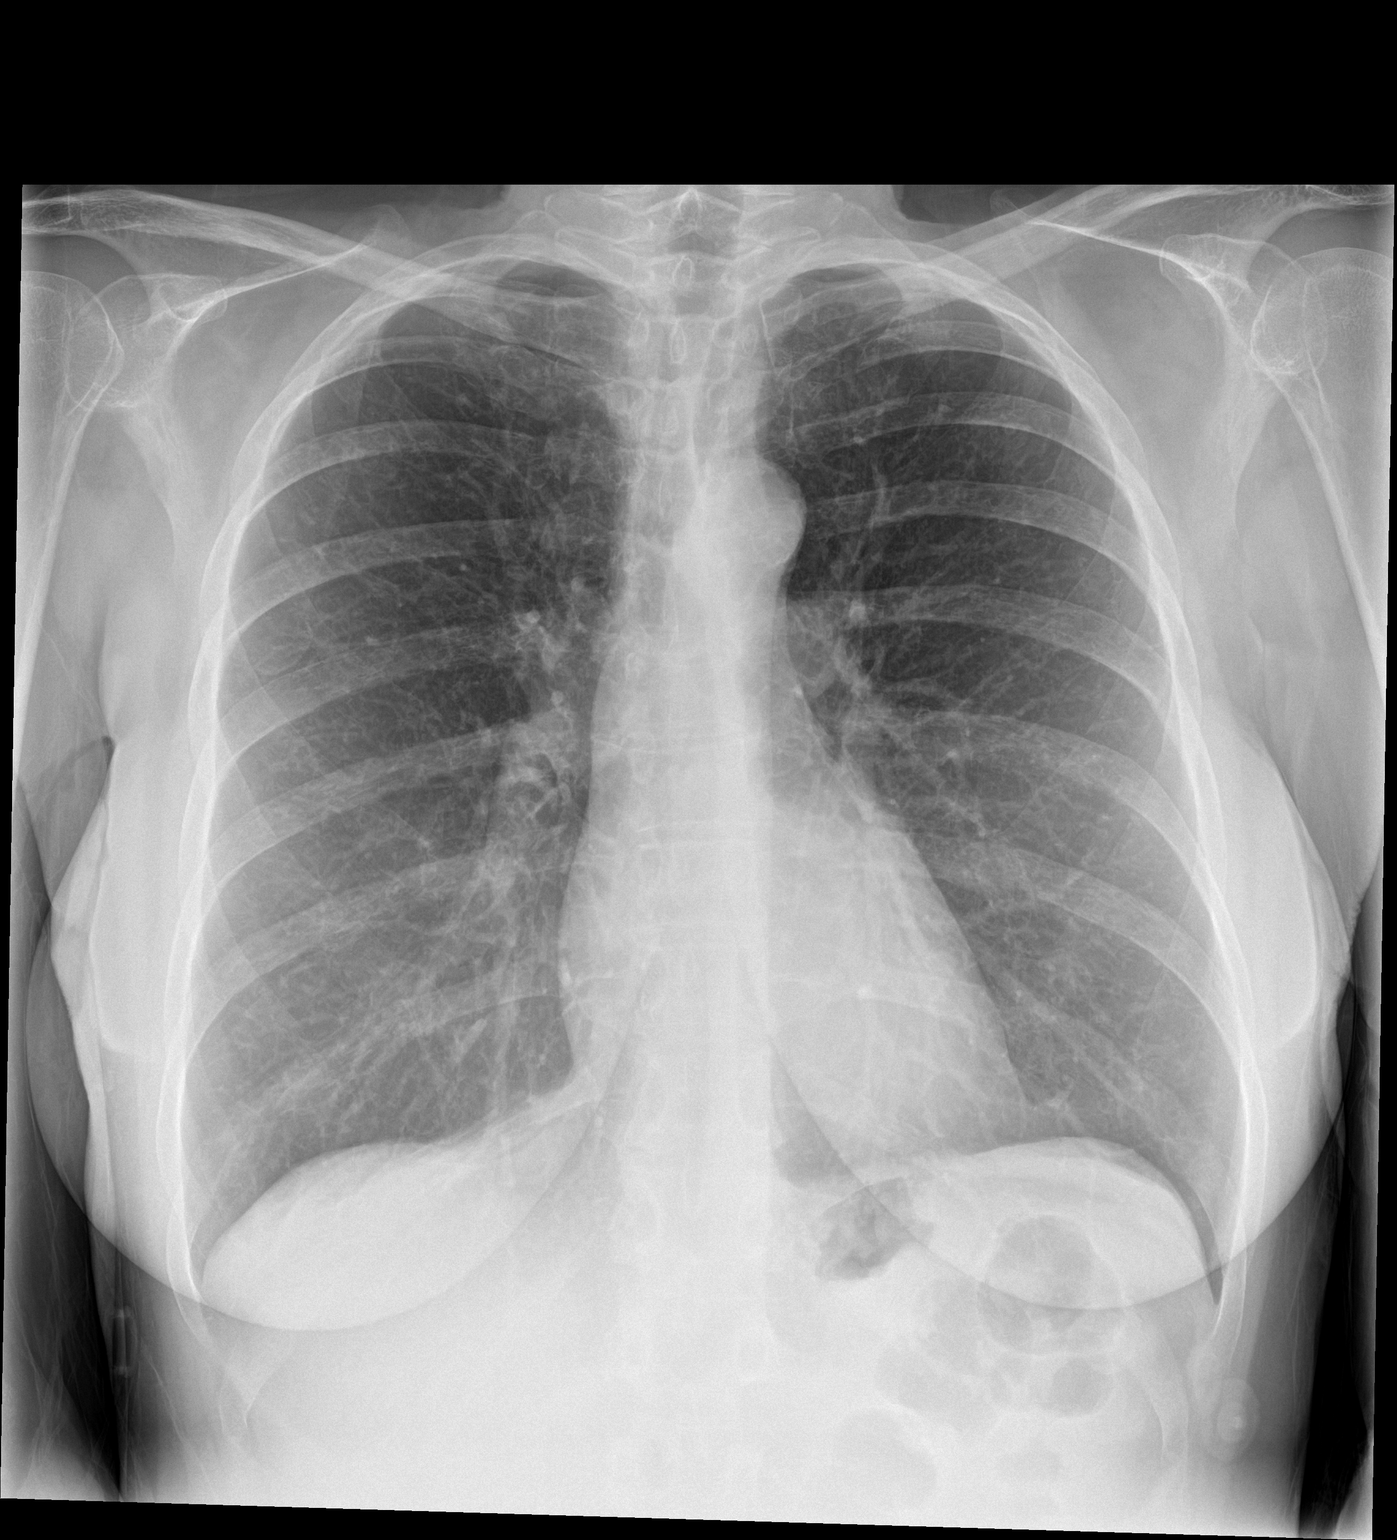

[chest lat]
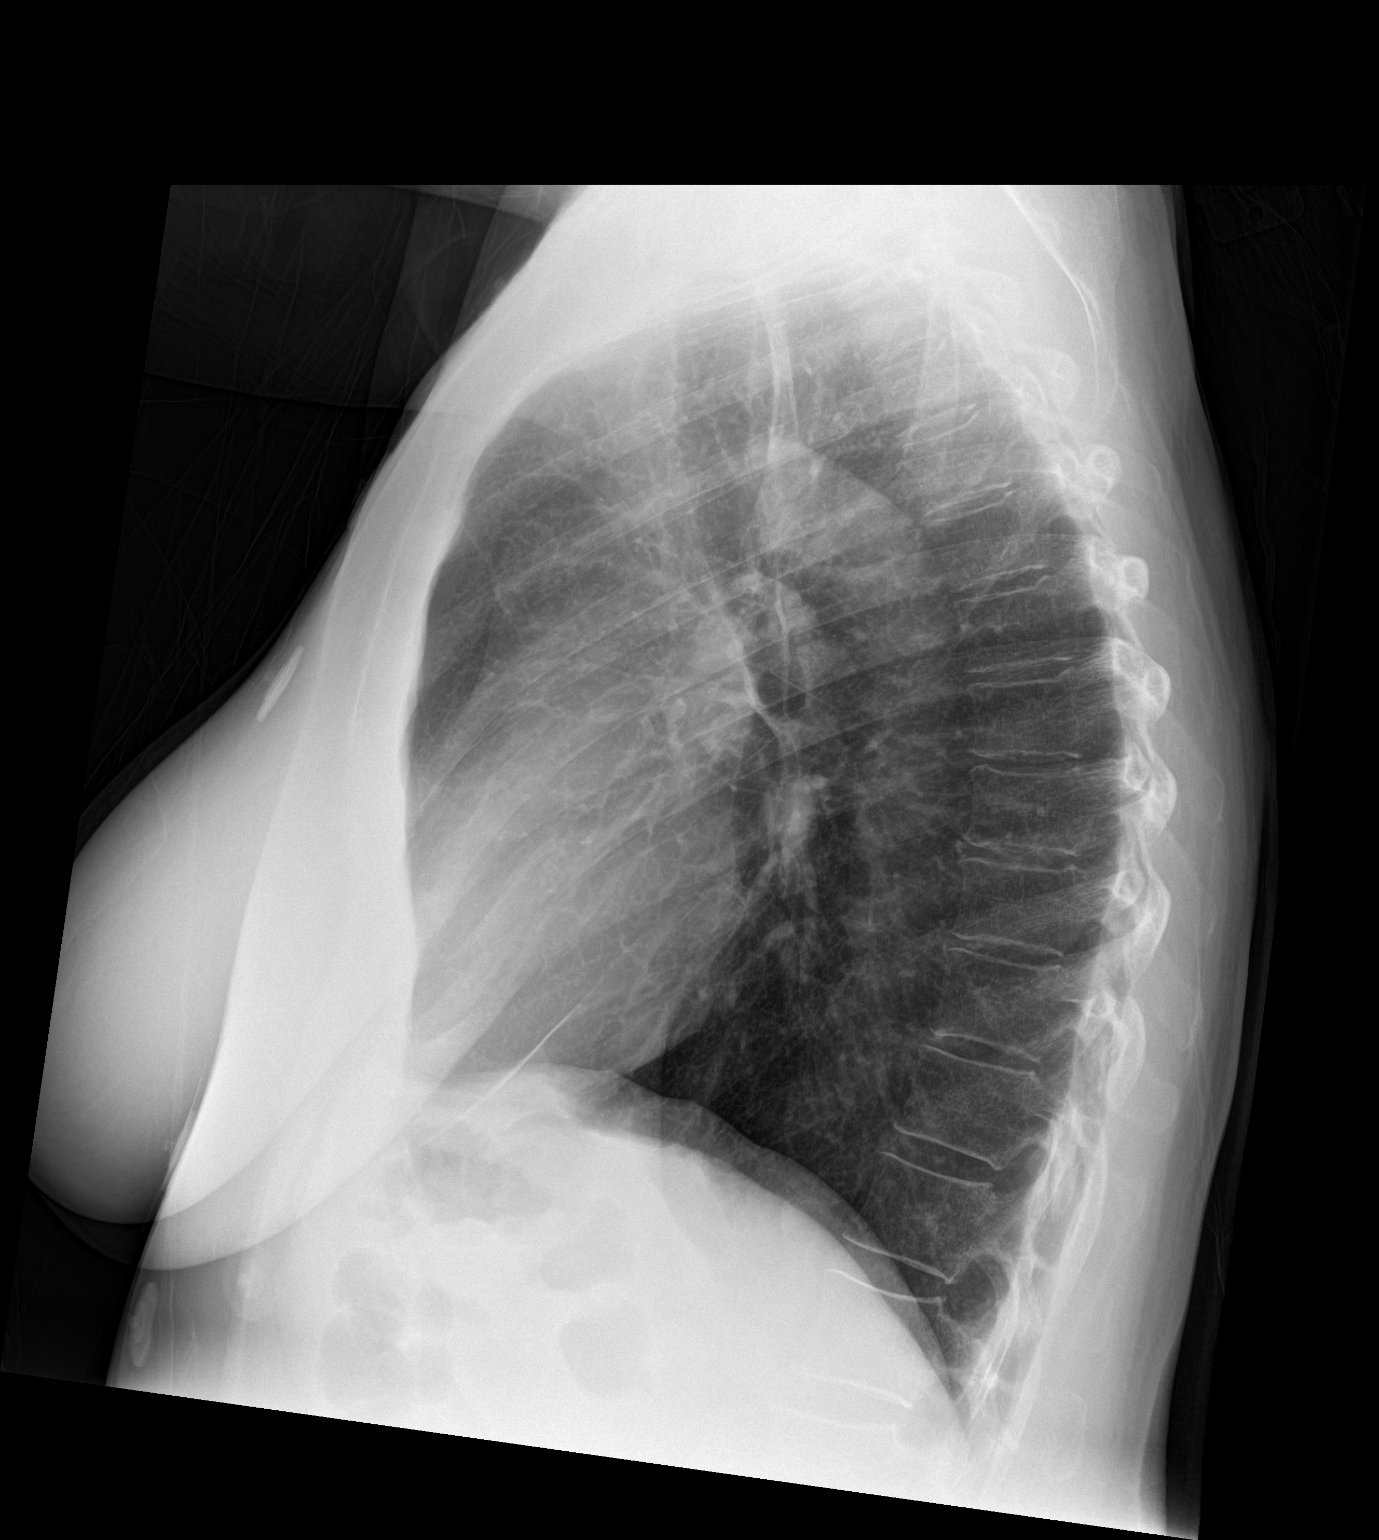

[2 of 2 positions shown; findings below may reference images not displayed]

FINDINGS: Cardiac shadow is within normal limits. The lungs are well aerated
bilaterally. No focal infiltrate or sizable effusion is seen. No
acute bony abnormality is noted.
IMPRESSION: No active disease.

## 2018-09-13 ENCOUNTER — Inpatient Hospital Stay
Admission: EM | Admit: 2018-09-13 | Discharge: 2018-09-16 | DRG: 190 | Disposition: A | Discharge status: Home / Self Care | Payer: Medicare Other | Attending: Internal Medicine | Admitting: Internal Medicine

## 2018-09-13 ENCOUNTER — Emergency Department: Payer: Medicare Other

## 2018-09-13 ENCOUNTER — Ambulatory Visit (INDEPENDENT_AMBULATORY_CARE_PROVIDER_SITE_OTHER)
Admission: EM | Admit: 2018-09-13 | Discharge: 2018-09-13 | Disposition: A | Discharge status: Home / Self Care | Payer: Medicare Other | Source: Home / Self Care | Attending: Family Medicine | Admitting: Family Medicine

## 2018-09-13 ENCOUNTER — Other Ambulatory Visit: Payer: Self-pay

## 2018-09-13 ENCOUNTER — Ambulatory Visit: Payer: Medicare Other

## 2018-09-13 DIAGNOSIS — Z90710 Acquired absence of both cervix and uterus: Secondary | ICD-10-CM | POA: Diagnosis not present

## 2018-09-13 DIAGNOSIS — Z881 Allergy status to other antibiotic agents status: Secondary | ICD-10-CM | POA: Diagnosis not present

## 2018-09-13 DIAGNOSIS — Z79891 Long term (current) use of opiate analgesic: Secondary | ICD-10-CM | POA: Diagnosis not present

## 2018-09-13 DIAGNOSIS — I9589 Other hypotension: Secondary | ICD-10-CM | POA: Diagnosis not present

## 2018-09-13 DIAGNOSIS — Z8249 Family history of ischemic heart disease and other diseases of the circulatory system: Secondary | ICD-10-CM

## 2018-09-13 DIAGNOSIS — K219 Gastro-esophageal reflux disease without esophagitis: Secondary | ICD-10-CM | POA: Diagnosis present

## 2018-09-13 DIAGNOSIS — G2581 Restless legs syndrome: Secondary | ICD-10-CM | POA: Diagnosis present

## 2018-09-13 DIAGNOSIS — G894 Chronic pain syndrome: Secondary | ICD-10-CM | POA: Diagnosis present

## 2018-09-13 DIAGNOSIS — R0902 Hypoxemia: Secondary | ICD-10-CM

## 2018-09-13 DIAGNOSIS — G4700 Insomnia, unspecified: Secondary | ICD-10-CM | POA: Diagnosis present

## 2018-09-13 DIAGNOSIS — J9601 Acute respiratory failure with hypoxia: Secondary | ICD-10-CM | POA: Diagnosis present

## 2018-09-13 DIAGNOSIS — Z885 Allergy status to narcotic agent status: Secondary | ICD-10-CM

## 2018-09-13 DIAGNOSIS — Z716 Tobacco abuse counseling: Secondary | ICD-10-CM

## 2018-09-13 DIAGNOSIS — E785 Hyperlipidemia, unspecified: Secondary | ICD-10-CM | POA: Diagnosis present

## 2018-09-13 DIAGNOSIS — Z122 Encounter for screening for malignant neoplasm of respiratory organs: Secondary | ICD-10-CM

## 2018-09-13 DIAGNOSIS — Z79899 Other long term (current) drug therapy: Secondary | ICD-10-CM | POA: Diagnosis not present

## 2018-09-13 DIAGNOSIS — J441 Chronic obstructive pulmonary disease with (acute) exacerbation: Secondary | ICD-10-CM

## 2018-09-13 DIAGNOSIS — Z880 Allergy status to penicillin: Secondary | ICD-10-CM

## 2018-09-13 DIAGNOSIS — Z20828 Contact with and (suspected) exposure to other viral communicable diseases: Secondary | ICD-10-CM | POA: Diagnosis present

## 2018-09-13 DIAGNOSIS — R0602 Shortness of breath: Secondary | ICD-10-CM

## 2018-09-13 DIAGNOSIS — F329 Major depressive disorder, single episode, unspecified: Secondary | ICD-10-CM | POA: Diagnosis present

## 2018-09-13 DIAGNOSIS — J302 Other seasonal allergic rhinitis: Secondary | ICD-10-CM | POA: Diagnosis present

## 2018-09-13 DIAGNOSIS — F419 Anxiety disorder, unspecified: Secondary | ICD-10-CM | POA: Diagnosis present

## 2018-09-13 DIAGNOSIS — F17210 Nicotine dependence, cigarettes, uncomplicated: Secondary | ICD-10-CM | POA: Diagnosis present

## 2018-09-13 DIAGNOSIS — Z7951 Long term (current) use of inhaled steroids: Secondary | ICD-10-CM | POA: Diagnosis not present

## 2018-09-13 DIAGNOSIS — R4702 Dysphasia: Secondary | ICD-10-CM | POA: Diagnosis present

## 2018-09-13 DIAGNOSIS — Z85828 Personal history of other malignant neoplasm of skin: Secondary | ICD-10-CM

## 2018-09-13 LAB — BASIC METABOLIC PANEL
Anion gap: 6 (ref 5–15)
BUN: 7 mg/dL — ABNORMAL LOW (ref 8–23)
CO2: 32 mmol/L (ref 22–32)
Calcium: 8 mg/dL — ABNORMAL LOW (ref 8.9–10.3)
Chloride: 101 mmol/L (ref 98–111)
Creatinine, Ser: 0.47 mg/dL (ref 0.44–1.00)
GFR calc Af Amer: >60 mL/min (ref >60)
GFR calc non Af Amer: >60 mL/min (ref >60)
Glucose, Bld: 93 mg/dL (ref 70–99)
Potassium: 4.5 mmol/L (ref 3.5–5.1)
Sodium: 139 mmol/L (ref 135–145)

## 2018-09-13 LAB — BRAIN NATRIURETIC PEPTIDE: B Natriuretic Peptide: 32 pg/mL (ref 0.0–100.0)

## 2018-09-13 LAB — CBC WITH DIFFERENTIAL/PLATELET
Abs Immature Granulocytes: 0.05 10*3/uL (ref 0.00–0.07)
Basophils Absolute: 0.1 10*3/uL (ref 0.0–0.1)
Basophils Relative: 1 %
Eosinophils Absolute: 0.1 10*3/uL (ref 0.0–0.5)
Eosinophils Relative: 1 %
HCT: 45.1 % (ref 36.0–46.0)
Hemoglobin: 14.5 g/dL (ref 12.0–15.0)
Immature Granulocytes: 0 %
Lymphocytes Relative: 17 %
Lymphs Abs: 2.1 10*3/uL (ref 0.7–4.0)
MCH: 32 pg (ref 26.0–34.0)
MCHC: 32.2 g/dL (ref 30.0–36.0)
MCV: 99.6 fL (ref 80.0–100.0)
Monocytes Absolute: 0.8 10*3/uL (ref 0.1–1.0)
Monocytes Relative: 6 %
Neutro Abs: 9.3 10*3/uL — ABNORMAL HIGH (ref 1.7–7.7)
Neutrophils Relative %: 75 %
Platelets: 269 10*3/uL (ref 150–400)
RBC: 4.53 MIL/uL (ref 3.87–5.11)
RDW: 15.1 % (ref 11.5–15.5)
WBC: 12.5 10*3/uL — ABNORMAL HIGH (ref 4.0–10.5)
nRBC: 0 % (ref 0.0–0.2)

## 2018-09-13 LAB — SARS CORONAVIRUS 2 BY RT PCR (HOSPITAL ORDER, PERFORMED IN ~~LOC~~ HOSPITAL LAB): SARS Coronavirus 2: NEGATIVE

## 2018-09-13 LAB — TROPONIN I (HIGH SENSITIVITY): Troponin I (High Sensitivity): 10 ng/L (ref <18)

## 2018-09-13 MED ORDER — IPRATROPIUM-ALBUTEROL 0.5-2.5 (3) MG/3ML IN SOLN
3.0000 mL | Freq: Once | RESPIRATORY_TRACT | Status: AC
  Administered 2018-09-13: 3 mL via RESPIRATORY_TRACT
  Filled 2018-09-13: qty 3

## 2018-09-13 MED ORDER — CYCLOBENZAPRINE HCL 10 MG PO TABS
20.0000 mg | ORAL_TABLET | Freq: Every day | ORAL | Status: DC
  Administered 2018-09-13 – 2018-09-15 (×3): 20 mg via ORAL
  Filled 2018-09-13 (×3): qty 2

## 2018-09-13 MED ORDER — MORPHINE SULFATE 15 MG PO TABS
15.0000 mg | ORAL_TABLET | ORAL | Status: DC | PRN
  Administered 2018-09-13 – 2018-09-14 (×3): 15 mg via ORAL
  Filled 2018-09-13 (×3): qty 1

## 2018-09-13 MED ORDER — TRAZODONE HCL 50 MG PO TABS
150.0000 mg | ORAL_TABLET | Freq: Every evening | ORAL | Status: DC | PRN
  Administered 2018-09-13 – 2018-09-15 (×3): 150 mg via ORAL
  Filled 2018-09-13 (×3): qty 3

## 2018-09-13 MED ORDER — LEVOFLOXACIN 750 MG PO TABS
750.0000 mg | ORAL_TABLET | Freq: Once | ORAL | Status: AC
  Administered 2018-09-13: 750 mg via ORAL
  Filled 2018-09-13: qty 1

## 2018-09-13 MED ORDER — UMECLIDINIUM-VILANTEROL 62.5-25 MCG/INH IN AEPB
1.0000 | INHALATION_SPRAY | Freq: Every day | RESPIRATORY_TRACT | Status: DC
  Administered 2018-09-14 (×2): 1 via RESPIRATORY_TRACT
  Filled 2018-09-13: qty 14

## 2018-09-13 MED ORDER — GABAPENTIN 300 MG PO CAPS
300.0000 mg | ORAL_CAPSULE | Freq: Three times a day (TID) | ORAL | Status: DC
  Administered 2018-09-13 – 2018-09-16 (×9): 300 mg via ORAL
  Filled 2018-09-13 (×9): qty 1

## 2018-09-13 MED ORDER — PANTOPRAZOLE SODIUM 40 MG PO TBEC
40.0000 mg | DELAYED_RELEASE_TABLET | Freq: Every day | ORAL | Status: DC
  Administered 2018-09-14 – 2018-09-16 (×3): 40 mg via ORAL
  Filled 2018-09-13 (×3): qty 1

## 2018-09-13 MED ORDER — METHYLPREDNISOLONE SODIUM SUCC 125 MG IJ SOLR
125.0000 mg | Freq: Once | INTRAMUSCULAR | Status: AC
  Administered 2018-09-13: 125 mg via INTRAVENOUS
  Filled 2018-09-13: qty 2

## 2018-09-13 MED ORDER — IPRATROPIUM-ALBUTEROL 0.5-2.5 (3) MG/3ML IN SOLN
3.0000 mL | Freq: Four times a day (QID) | RESPIRATORY_TRACT | Status: DC
  Administered 2018-09-14 (×3): 3 mL via RESPIRATORY_TRACT
  Filled 2018-09-13 (×4): qty 3

## 2018-09-13 MED ORDER — PREDNISONE 20 MG PO TABS
40.0000 mg | ORAL_TABLET | Freq: Every day | ORAL | Status: DC
  Administered 2018-09-15 – 2018-09-16 (×2): 40 mg via ORAL
  Filled 2018-09-13 (×2): qty 2

## 2018-09-13 MED ORDER — BUPROPION HCL ER (SR) 100 MG PO TB12
100.0000 mg | ORAL_TABLET | Freq: Two times a day (BID) | ORAL | Status: DC
  Administered 2018-09-14 – 2018-09-16 (×6): 100 mg via ORAL
  Filled 2018-09-13 (×7): qty 1

## 2018-09-13 MED ORDER — DULOXETINE HCL 30 MG PO CPEP
60.0000 mg | ORAL_CAPSULE | Freq: Every day | ORAL | Status: DC
  Administered 2018-09-14 – 2018-09-16 (×3): 60 mg via ORAL
  Filled 2018-09-13 (×3): qty 2

## 2018-09-13 MED ORDER — AZITHROMYCIN 500 MG PO TABS
500.0000 mg | ORAL_TABLET | Freq: Every day | ORAL | Status: DC
  Administered 2018-09-15: 18:00:00 500 mg via ORAL
  Filled 2018-09-13: qty 1

## 2018-09-13 MED ORDER — METHYLPREDNISOLONE SODIUM SUCC 125 MG IJ SOLR
60.0000 mg | Freq: Three times a day (TID) | INTRAMUSCULAR | Status: AC
  Administered 2018-09-13 – 2018-09-14 (×3): 60 mg via INTRAVENOUS
  Filled 2018-09-13 (×4): qty 2

## 2018-09-13 MED ORDER — SODIUM CHLORIDE 0.9 % IV SOLN
500.0000 mg | INTRAVENOUS | Status: AC
  Administered 2018-09-14: 500 mg via INTRAVENOUS
  Filled 2018-09-13: qty 500

## 2018-09-13 MED ORDER — PRIMIDONE 250 MG PO TABS
250.0000 mg | ORAL_TABLET | Freq: Four times a day (QID) | ORAL | Status: DC
  Administered 2018-09-14 – 2018-09-16 (×11): 250 mg via ORAL
  Filled 2018-09-13 (×13): qty 1

## 2018-09-13 MED ORDER — ROSUVASTATIN CALCIUM 10 MG PO TABS
10.0000 mg | ORAL_TABLET | Freq: Every day | ORAL | Status: DC
  Administered 2018-09-14 – 2018-09-16 (×3): 10 mg via ORAL
  Filled 2018-09-13 (×3): qty 1

## 2018-09-13 MED ORDER — FERROUS SULFATE 325 (65 FE) MG PO TABS
325.0000 mg | ORAL_TABLET | Freq: Two times a day (BID) | ORAL | Status: DC
  Administered 2018-09-13 – 2018-09-16 (×6): 325 mg via ORAL
  Filled 2018-09-13 (×6): qty 1

## 2018-09-13 MED ORDER — ALBUTEROL SULFATE (2.5 MG/3ML) 0.083% IN NEBU
2.5000 mg | INHALATION_SOLUTION | RESPIRATORY_TRACT | Status: DC | PRN
  Filled 2018-09-13: qty 3

## 2018-09-13 NOTE — ED Provider Notes (Addendum)
[facility]    CSN: [id number] Arrival date & time: 09/13/18  1505     History   Chief Complaint Chief Complaint  Patient presents with  . Shortness of Breath    HPI Laura Bates is a 68 y.o. female.   68 yo female with a c/o shortness of breath, cough and chest pain with deep breaths for the past 3-4 days. Patient has a h/o COPD but does not require home oxygen. States cough is productive. Denies any fevers or chills.    Shortness of Breath   Past Medical History:  Diagnosis Date  . Allergic state   . Anxiety   . Back pain   . COPD, mild ([place])   . Cough   . Depression   . Dysphagia   . Eosinophilic esophagitis   . Esophageal web   . GERD (gastroesophageal reflux disease)   . Grief at loss of child   . H/O shoulder surgery   . Hyperlipidemia   . Insomnia   . Palpitations   . RLS (restless legs syndrome)   . Rotator cuff tear - Right   . Tobacco use     Patient Active Problem List   Diagnosis Date Noted  . Exertional dyspnea 03/09/2015  . Tobacco use 03/09/2015  . H/O shoulder surgery 10/14/2014  . Allergic state 08/16/2014  . Rupture long head biceps tendon 07/22/2014  . Sprain of right rotator cuff capsule 07/22/2014  . Can't get food down 01/27/2013  . Abnormal finding on GI tract imaging 01/27/2013  . Cough 07/19/2011  . H/O elevated lipids 07/19/2011  . Awareness of heartbeats 07/19/2011  . Aggrieved 06/07/2011  . Clinical depression 06/06/2011  . Acid reflux 11/27/2010  . Anxiety 11/27/2010  . Back ache 11/27/2010  . EE (eosinophilic esophagitis) 97/35/3299  . 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 History of tobacco use 11/27/2010  . Cannot sleep 11/27/2010  . Restless leg 11/27/2010    Past Surgical History:  Procedure Laterality Date  . ABDOMINAL HYSTERECTOMY    . benign tumor     breast  . BREAST LUMPECTOMY    . COLONOSCOPY WITH PROPOFOL N/A 10/18/2015   Procedure: COLONOSCOPY WITH PROPOFOL;  Surgeon: Lollie Sails,  MD;  Location: [facility] ENDOSCOPY;  Service: Endoscopy;  Laterality: N/A;  . ESOPHAGOGASTRODUODENOSCOPY    . Right Rotator Cuff Repair Right   . SKIN CANCER EXCISION      OB History   No obstetric history on file.      Home Medications    Prior to Admission medications   Medication Sig Start Date End Date Taking? Authorizing Provider  albuterol (PROVENTIL HFA) 108 (90 Base) MCG/ACT inhaler Inhale 2 puffs into the lungs every 4 (four) hours as needed for wheezing or shortness of breath. 02/18/15  Yes Carrie Mew, MD  albuterol (PROVENTIL HFA;VENTOLIN HFA) 108 (90 Base) MCG/ACT inhaler Inhale 2 puffs into the lungs every 4 (four) hours as needed. 11/15/16  Yes Frederich Cha, MD  benzonatate (TESSALON) 200 MG capsule Take 1 capsule (200 mg total) by mouth 3 (three) times daily as needed. 11/15/16  Yes Frederich Cha, MD  buprenorphine Haze Rushing) 10 MCG/HR PTWK patch Place 10 mcg onto the skin once a week.   Yes [provider]  buPROPion (WELLBUTRIN SR) 100 MG 12 hr tablet Take 1 tablet (100 mg total) by mouth 2 (two) times daily. 10/19/14  Yes Marjie Skiff, MD  cetirizine (ZYRTEC) 10 MG tablet Take 1 tablet (10 mg total)  by mouth daily. 07/15/15  Yes Jan Fireman, PA-C  DULoxetine (CYMBALTA) 60 MG capsule Take 1 capsule (60 mg total) by mouth daily. 10/19/14  Yes Marjie Skiff, MD  Fluticasone Furoate-Vilanterol (BREO ELLIPTA) 100-25 MCG/INH AEPB Inhale 1 puff into the lungs daily. 03/21/15  Yes Wilhelmina Mcardle, MD  HYDROmorphone (DILAUDID) 2 MG tablet Take by mouth every 4 (four) hours as needed for severe pain.   Yes [provider]  LORazepam (ATIVAN) 1 MG tablet Take 1 tablet (1 mg total) by mouth 2 (two) times daily. Patient taking differently: Take 1 mg by mouth daily.  10/19/14  Yes Marjie Skiff, MD  methadone (DOLOPHINE) 5 MG tablet Take by mouth. 10/15/17  Yes [provider]  omeprazole (PRILOSEC) 40 MG capsule Take by mouth.   Yes [provider]  phenazopyridine (PYRIDIUM) 200 MG tablet Take 1 tablet (200 mg total) by mouth 3 (three) times daily as needed for pain. 11/15/16  Yes Frederich Cha, MD  primidone (MYSOLINE) 250 MG tablet Take 250 mg by mouth 4 (four) times daily.   Yes [provider]  rosuvastatin (CRESTOR) 10 MG tablet Take 10 mg by mouth daily.   Yes [provider]  traZODone (DESYREL) 100 MG tablet Take 1.5 tablets (150 mg total) by mouth at bedtime as needed for sleep. Patient taking differently: Take 100 mg by mouth at bedtime as needed for sleep.  10/19/14  Yes Marjie Skiff, MD  azithromycin (ZITHROMAX) 250 MG tablet Take 1 tablet (250 mg total) by mouth daily. Take first 2 tablets together, then 1 every day until finished. 07/15/15   Jan Fireman, PA-C  morphine (KADIAN) 10 MG 24 hr capsule TAKE 1 CAPSULE (10 MG TOTAL) BY MOUTH DAILY. DNF  07/11/18 07/23/18   [provider]  predniSONE (STERAPRED UNI-PAK 21 TAB) 10 MG (21) TBPK tablet Sig 6 tablet day 1, 5 tablets day 2, 4 tablets day 3,,3tablets day 4, 2 tablets day 5, 1 tablet day 6 take all tablets orally 11/15/16   Frederich Cha, MD  primidone (MYSOLINE) 50 MG tablet Take 50 mg by mouth 2 (two) times daily.  05/04/14 05/04/15  [provider]    Family History Family History  Problem Relation Age of Onset  . Heart attack Father   . Diabetes Father   . Heart Problems Maternal Grandmother     Social History Social History   Tobacco Use  . Smoking status: Current Every Day Smoker    Packs/day: 1.00    Years: 45.00    Pack years: 45.00    Types: Cigarettes    Start date: 10/19/1974  . Smokeless tobacco: Never Used  Substance Use Topics  . Alcohol use: No    Alcohol/week: 0.0 standard drinks  . Drug use: No     Allergies   Oxycodone, Cephalexin, Hydrocodone, and Penicillins   Review of Systems Review of Systems  Respiratory: Positive for shortness of breath.      Physical Exam Triage Vital Signs  ED Triage Vitals  Enc Vitals Group     BP 09/13/18 1535 115/87     Pulse Rate 09/13/18 1535 (!) 145     Resp 09/13/18 1535 18     Temp 09/13/18 1535 98.2 F (36.8 C)     Temp Source 09/13/18 1535 Oral     SpO2 09/13/18 1535 (!) 70 %     Weight 09/13/18 1541 108 lb (49 kg)     Height --  Head Circumference --      Peak Flow --      Pain Score 09/13/18 1541 10     Pain Loc --      Pain Edu? --      Excl. in [place]? --    No data found.  Updated Vital Signs BP 115/87 (BP Location: Right Arm)   Pulse 85   Temp 98.2 F (36.8 C) (Oral)   Resp 18   Wt 49 kg   LMP  (LMP Unknown)   SpO2 99%   BMI 19.75 kg/m    Recheck O2 sat on RA: 84-85% Recheck O2 sat on 2L 98-99%  Visual Acuity Right Eye Distance:   Left Eye Distance:   Bilateral Distance:    Right Eye Near:   Left Eye Near:    Bilateral Near:     Physical Exam Vitals signs and nursing note reviewed.  Constitutional:      General: She is not in acute distress.    Appearance: She is ill-appearing. She is not toxic-appearing or diaphoretic.  Cardiovascular:     Rate and Rhythm: Tachycardia present.     Heart sounds: Normal heart sounds.  Pulmonary:     Effort: Accessory muscle usage present.     Breath sounds: No stridor. Rales (bases (L>R)) present.     Comments: Decreased breath sounds bilaterally Neurological:     Mental Status: She is alert.      UC Treatments / Results  Labs (all labs ordered are listed, but only abnormal results are displayed) Labs Reviewed - No data to display  EKG   Radiology No results found.  Procedures ED EKG  Date/Time: 09/13/2018 4:14 PM Performed by: Norval Gable, MD Authorized by: Norval Gable, MD   ECG reviewed by ED Physician in the absence of a cardiologist: yes   Previous ECG:    Previous ECG:  Unavailable Interpretation:    Interpretation: normal   Rate:    ECG rate:  87   ECG rate assessment: normal   Rhythm:    Rhythm: sinus rhythm   Ectopy:     Ectopy: none   QRS:    QRS axis:  Normal Conduction:    Conduction: normal   ST segments:    ST segments:  Normal T waves:    T waves: normal     (including critical care time)  Medications Ordered in UC Medications - No data to display  Initial Impression / Assessment and Plan / UC Course  I have reviewed the triage vital signs and the nursing notes.  Pertinent labs & imaging results that were available during my care of the patient were reviewed by me and considered in my medical decision making (see chart for details).      Final Clinical Impressions(s) / UC Diagnoses   Final diagnoses:  Hypoxia  SOB (shortness of breath)  COPD exacerbation [facility])    ED Prescriptions    None     1. diagnoses reviewed with patient; recommend patient go to Emergency Department by EMS for further evaluation and management. Patient placed on 2L O2 per [place]; IV saline lock. Patient in stable condition transferred by EMS.      Controlled Substance Prescriptions National Controlled Substance Registry consulted? Not Applicable   Norval Gable, MD 09/13/18 [place], MD 09/13/18 [phone]

## 2018-09-13 NOTE — ED Triage Notes (Signed)
Pt comes EMS from [facility] with chronic COPD SOB. Pt said Sob started worsening about 4 days ago. Pt afebrile with EMS. Pt was 84% on RA. Pt satting at 97% on 4L [place]. Pt able to move to bed herself. 107/55 with EMS. Pt went today for right upper lung pain.

## 2018-09-13 NOTE — ED Triage Notes (Signed)
Pt here for Upper right lung pain, SOB, off balanced gait(states she is wobbly from her medication), does smoke and has been feeling bad for 3-4 days. Productive cough with beige mucus.

## 2018-09-13 NOTE — Progress Notes (Signed)
Family Meeting Note  Advance Directive:yes  Today a meeting took place with the Patient.    The following clinical team members were present during this meeting:MD  The following were discussed:Patient's diagnosis: Acute hypoxemic respiratory failure, acute COPD exacerbation, tobacco abuse disorder, anxiety, insomnia, treatment plan of care discussed in detail with the patient.  She verbalized understanding of the plan   , Patient's progosis: Unable to determine and Goals for treatment: Full Code husband and daughter-in-law Judie Petit are the healthcare power of attorney  Additional follow-up to be provided: Hospitalist  Time spent during discussion:17 min   Nicholes Mango, MD

## 2018-09-13 NOTE — H&P (Signed)
[place] [facility] [place] NAME: Laura Bates    MR#:  [id number]  DATE OF BIRTH:  [birth year]  DATE OF ADMISSION:  09/13/2018  PRIMARY CARE PHYSICIAN: Clarisse Gouge, MD   REQUESTING/REFERRING PHYSICIAN: Earleen Newport, MD  CHIEF COMPLAINT:   Shortness of breath HISTORY OF PRESENT ILLNESS:  Laura Bates  is a 68 y.o. female with a known history of anxiety, COPD, depression, hyperlipidemia restless leg syndrome, continues to smoke is presenting to the ED with a chief complaint of shortness of breath.  Patient had been to [facility] urgent care with worsening of shortness of breath for 4 days and she is sent over to the ED  as she was having shortness of breath requiring oxygen.  Patient was de-satting to 84% on room air requiring 4 L of oxygen.  She was given steroids and breathing treatments with no improvement and hospitalist team is called to admit the patient.  Patient continues to smoke  PAST MEDICAL HISTORY:   Past Medical History:  Diagnosis Date  . Allergic state   . Anxiety   . Back pain   . COPD, mild ([place])   . Cough   . Depression   . Dysphagia   . Eosinophilic esophagitis   . Esophageal web   . GERD (gastroesophageal reflux disease)   . Grief at loss of child   . H/O shoulder surgery   . Hyperlipidemia   . Insomnia   . Palpitations   . RLS (restless legs syndrome)   . Rotator cuff tear - Right   . Tobacco use     PAST SURGICAL HISTOIRY:   Past Surgical History:  Procedure Laterality Date  . ABDOMINAL HYSTERECTOMY    . benign tumor     breast  . BREAST LUMPECTOMY    . COLONOSCOPY WITH PROPOFOL N/A 10/18/2015   Procedure: COLONOSCOPY WITH PROPOFOL;  Surgeon: Lollie Sails, MD;  Location[facility] ENDOSCOPY;  Service: Endoscopy;  Laterality: N/A;  . ESOPHAGOGASTRODUODENOSCOPY    . Right Rotator Cuff Repair Right   . SKIN CANCER EXCISION      SOCIAL HISTORY:   Social History   Tobacco Use  . Smoking  status: Current Every Day Smoker    Packs/day: 1.00    Years: 45.00    Pack years: 45.00    Types: Cigarettes    Start date: 10/19/1974  . Smokeless tobacco: Never Used  Substance Use Topics  . Alcohol use: No    Alcohol/week: 0.0 standard drinks    FAMILY HISTORY:   Family History  Problem Relation Age of Onset  . Heart attack Father   . Diabetes Father   . Heart Problems Maternal Grandmother     DRUG ALLERGIES:   Allergies  Allergen Reactions  . Oxycodone Itching  . Cephalexin Swelling  . Hydrocodone Other (See Comments)    Pt can take this med  . Penicillins Hives, Rash and Other (See Comments)    Other Reaction: OTHER REACTION BLEEDING THRU S    REVIEW OF SYSTEMS:  CONSTITUTIONAL: No fever, fatigue or weakness.  EYES: No blurred or double vision.  EARS, NOSE, AND THROAT: No tinnitus or ear pain.  RESPIRATORY: Reports cough, tightness in her chest shortness of breath, wheezing, denies hemoptysis.  CARDIOVASCULAR: No chest pain, orthopnea, edema.  GASTROINTESTINAL: No nausea, vomiting, diarrhea or abdominal pain.  GENITOURINARY: No dysuria, hematuria.  ENDOCRINE: No polyuria, nocturia,  HEMATOLOGY: No anemia, easy bruising or bleeding SKIN: No  rash or lesion. MUSCULOSKELETAL: No joint pain or arthritis.   NEUROLOGIC: No tingling, numbness, weakness.  PSYCHIATRY: No anxiety or depression.   MEDICATIONS AT HOME:   Prior to Admission medications   Medication Sig Start Date End Date Taking? Authorizing Provider  albuterol (PROVENTIL HFA;VENTOLIN HFA) 108 (90 Base) MCG/ACT inhaler Inhale 2 puffs into the lungs every 4 (four) hours as needed. 11/15/16  Yes Frederich Cha, MD  buPROPion [facility] SR) 100 MG 12 hr tablet Take 1 tablet (100 mg total) by mouth 2 (two) times daily. Patient taking differently: Take 100 mg by mouth 3 (three) times daily.  10/19/14  Yes Marjie Skiff, MD  cyclobenzaprine (FLEXERIL) 10 MG tablet Take 20 mg by mouth at bedtime.  05/27/17  Yes  [provider]  DULoxetine (CYMBALTA) 60 MG capsule Take 1 capsule (60 mg total) by mouth daily. 10/19/14  Yes Marjie Skiff, MD  ferrous sulfate 325 (65 FE) MG tablet Take 325 mg by mouth 2 (two) times a day.   Yes [provider]  gabapentin (NEURONTIN) 300 MG capsule Take 300 mg by mouth 3 (three) times daily. 09/07/18  Yes [provider]  morphine (MSIR) 15 MG tablet Take 15 mg by mouth every 4 (four) hours as needed. 08/25/18  Yes [provider]  omeprazole (PRILOSEC) 40 MG capsule Take 40 mg by mouth daily.    Yes [provider]  primidone (MYSOLINE) 250 MG tablet Take 250 mg by mouth 4 (four) times daily.   Yes [provider]  rosuvastatin (CRESTOR) 10 MG tablet Take 10 mg by mouth daily.   Yes [provider]  traZODone (DESYREL) 100 MG tablet Take 1.5 tablets (150 mg total) by mouth at bedtime as needed for sleep. 10/19/14  Yes Marjie Skiff, MD      VITAL SIGNS:  Blood pressure 123/67, pulse (!) 101, temperature 99.3 F (37.4 C), temperature source Oral, resp. rate 19, height 5\' 2"  (1.575 m), weight 48 kg, SpO2 95 %.  PHYSICAL EXAMINATION:  GENERAL:  68 y.o.-year-old patient lying in the bed with no acute distress.  EYES: Pupils equal, round, reactive to light and accommodation. No scleral icterus. Extraocular muscles intact.  HEENT: Head atraumatic, normocephalic. Oropharynx and nasopharynx clear.  NECK:  Supple, no jugular venous distention. No thyroid enlargement, no tenderness.  LUNGS: Diminished breath sounds bilaterally, minimal wheezing, no rales,rhonchi or crepitation. No use of accessory muscles of respiration.  CARDIOVASCULAR: S1, S2 normal. No murmurs, rubs, or gallops.  ABDOMEN: Soft, nontender, nondistended. Bowel sounds present. No organomegaly or mass.  EXTREMITIES: No pedal edema, cyanosis, or clubbing.  NEUROLOGIC: Cranial nerves II through XII are intact. Muscle strength generalized weakness in  all extremities. Sensation intact. Gait not checked.  PSYCHIATRIC: The patient is alert and oriented x 3.  SKIN: No obvious rash, lesion, or ulcer.   LABORATORY PANEL:   CBC Recent Labs  Lab 09/13/18 1705  WBC 12.5*  HGB 14.5  HCT 45.1  PLT 269   ------------------------------------------------------------------------------------------------------------------  Chemistries  Recent Labs  Lab 09/13/18 1705  NA 139  K 4.5  CL 101  CO2 32  GLUCOSE 93  BUN 7*  CREATININE 0.47  CALCIUM 8.0*   ------------------------------------------------------------------------------------------------------------------  Cardiac Enzymes No results for input(s): TROPONINI in the last 168 hours. ------------------------------------------------------------------------------------------------------------------  RADIOLOGY:  Dg Chest Port 1 View  Result Date: 09/13/2018 CLINICAL DATA:  Acute shortness of breath for 4 days EXAM: PORTABLE CHEST 1 VIEW COMPARISON:  02/18/2015 and prior radiographs  FINDINGS: The cardiomediastinal silhouette is unremarkable. There is no evidence of focal airspace disease, pulmonary edema, suspicious pulmonary nodule/mass, pleural effusion, or pneumothorax. No acute bony abnormalities are identified. IMPRESSION: No active disease. Electronically Signed   By: Margarette Canada M.D.   On: 09/13/2018 17:28    EKG:   Orders placed or performed during the hospital encounter of 09/13/18  . EKG 12-Lead  . EKG 12-Lead  . ED EKG  . ED EKG    IMPRESSION AND PLAN:    #Acute hypoxemic respiratory failure from COPD exacerbation  Admit to MedSurg unit IV steroids and bronchodilator treatments Azithromycin for prophylaxis Sputum culture and sensitivity Chest x-ray negative COVID test negative.  Chest x-ray negative  #Insomnia continue home medication trazodone  #Chronic history of anxiety continue her home medication Cymbalta  #GERD PPI  #Tobacco abuse disorder  counseled patient to quit smoking for 5 minutes.  She verbalized understanding of the plan.  She is agreeable to nicotine patch  DVT prophylaxis with Lovenox subcu GI prophylaxis with Protonix   All the records are reviewed and case discussed with ED provider. Management plans discussed with the patient, family and they are in agreement.  CODE STATUS: Full code  TOTAL TIME TAKING CARE OF THIS PATIENT: 43 minutes.   Note: This dictation was prepared with Dragon dictation along with smaller phrase technology. Any transcriptional errors that result from this process are unintentional.  Nicholes Mango M.D on 09/13/2018 at 8:45 PM  Between 7am to 6pm - Pager - [phone]  After 6pm go to [email or web address] - password [email or web address] [facility]  [facility]  Office  [phone]  CC: Primary care physician; Clarisse Gouge, MD

## 2018-09-13 NOTE — ED Notes (Signed)
Pt oxygen turned off by MD to test RA sats. Pt dropped to 82-83%. Pt placed back on 4L. Pt agreeable to stay a night for monitoring and oxygen. Pt at 92% on the 4L.

## 2018-09-13 NOTE — ED Provider Notes (Signed)
[facility] Emergency Department Provider Note       Time seen: ----------------------------------------- 5:09 PM on 09/13/2018 -----------------------------------------   I have reviewed the triage vital signs and the nursing notes.  HISTORY   Chief Complaint Shortness of Breath    HPI Laura Bates is a 68 y.o. female with a history of anxiety, COPD, depression, dysphasia, hyperlipidemia, restless leg syndrome who presents to the ED for shortness of breath.  Patient presents from [facility] urgent care with chronic COPD and shortness of breath that is been worsening over the past 4 days.  She is afebrile, was 84% on room air.  She has had some right upper lung pain.  Has had pleurisy before.  She also describes feeling this way when she had pneumonia.  Pain is 7 out of 10 in her back.  Past Medical History:  Diagnosis Date  . Allergic state   . Anxiety   . Back pain   . COPD, mild ([place])   . Cough   . Depression   . Dysphagia   . Eosinophilic esophagitis   . Esophageal web   . GERD (gastroesophageal reflux disease)   . Grief at loss of child   . H/O shoulder surgery   . Hyperlipidemia   . Insomnia   . Palpitations   . RLS (restless legs syndrome)   . Rotator cuff tear - Right   . Tobacco use     Patient Active Problem List   Diagnosis Date Noted  . Exertional dyspnea 03/09/2015  . Tobacco use 03/09/2015  . H/O shoulder surgery 10/14/2014  . Allergic state 08/16/2014  . Rupture long head biceps tendon 07/22/2014  . Sprain of right rotator cuff capsule 07/22/2014  . Can't get food down 01/27/2013  . Abnormal finding on GI tract imaging 01/27/2013  . Cough 07/19/2011  . H/O elevated lipids 07/19/2011  . Awareness of heartbeats 07/19/2011  . Aggrieved 06/07/2011  . Clinical depression 06/06/2011  . Acid reflux 11/27/2010  . Anxiety 11/27/2010  . Back ache 11/27/2010  . EE (eosinophilic esophagitis) 09/32/3557  . Esophageal tissue web  11/27/2010  . History of tobacco use 11/27/2010  . Cannot sleep 11/27/2010  . Restless leg 11/27/2010    Past Surgical History:  Procedure Laterality Date  . ABDOMINAL HYSTERECTOMY    . benign tumor     breast  . BREAST LUMPECTOMY    . COLONOSCOPY WITH PROPOFOL N/A 10/18/2015   Procedure: COLONOSCOPY WITH PROPOFOL;  Surgeon: Lollie Sails, MD;  Location: [facility] ENDOSCOPY;  Service: Endoscopy;  Laterality: N/A;  . ESOPHAGOGASTRODUODENOSCOPY    . Right Rotator Cuff Repair Right   . SKIN CANCER EXCISION      Allergies Oxycodone, Cephalexin, Hydrocodone, and Penicillins  Social History Social History   Tobacco Use  . Smoking status: Current Every Day Smoker    Packs/day: 1.00    Years: 45.00    Pack years: 45.00    Types: Cigarettes    Start date: 10/19/1974  . Smokeless tobacco: Never Used  Substance Use Topics  . Alcohol use: No    Alcohol/week: 0.0 standard drinks  . Drug use: No   Review of Systems Constitutional: Negative for fever. Cardiovascular: Negative for chest pain. Respiratory: Positive for shortness of breath, cough Gastrointestinal: Negative for abdominal pain, vomiting and diarrhea. Musculoskeletal: Positive for back pain Skin: Negative for rash. Neurological: Negative for headaches, focal weakness or numbness.  All systems negative/normal/unremarkable except as stated in the HPI  ____________________________________________  PHYSICAL EXAM:  VITAL SIGNS: ED Triage Vitals  Enc Vitals Group     BP 09/13/18 1657 (!) 115/91     Pulse Rate 09/13/18 1657 93     Resp 09/13/18 1657 20     Temp 09/13/18 1657 99.5 F (37.5 C)     Temp Source 09/13/18 1657 Oral     SpO2 09/13/18 1657 92 %     Weight 09/13/18 1654 105 lb 13.1 oz (48 kg)     Height 09/13/18 1654 5\' 2"  (1.575 m)     Head Circumference --      Peak Flow --      Pain Score 09/13/18 1653 7     Pain Loc --      Pain Edu? --      Excl. in [place]? --    Constitutional: Alert and oriented.  Well appearing and in no distress. Eyes: Conjunctivae are normal. Normal extraocular movements. ENT      Head: Normocephalic and atraumatic.      Nose: No congestion/rhinnorhea.      Mouth/Throat: Mucous membranes are moist.      Neck: No stridor. Cardiovascular: Normal rate, regular rhythm. No murmurs, rubs, or gallops. Respiratory: Tachypnea with rhonchi bilaterally Gastrointestinal: Soft and nontender. Normal bowel sounds Musculoskeletal: Nontender with normal range of motion in extremities. No lower extremity tenderness nor edema. Neurologic:  Normal speech and language. No gross focal neurologic deficits are appreciated.  Skin:  Skin is warm, dry and intact. No rash noted. Psychiatric: Mood and affect are normal. Speech and behavior are normal.  ____________________________________________  EKG: Interpreted by me.  Sinus rhythm with rate of 91 bpm, normal PR interval, normal QRS, normal QT  ____________________________________________  ED COURSE:  As part of my medical decision making, I reviewed the following data within the [place] History obtained from family if available, nursing notes, old chart and ekg, as well as notes from prior ED visits. Patient presented for shortness of breath, we will assess with labs and imaging as indicated at this time.   Procedures  Laura Bates was evaluated in Emergency Department on 09/13/2018 for the symptoms described in the history of present illness. She was evaluated in the context of the global COVID-19 pandemic, which necessitated consideration that the patient might be at risk for infection with the SARS-CoV-2 virus that causes COVID-19. Institutional protocols and algorithms that pertain to the evaluation of patients at risk for COVID-19 are in a state of rapid change based on information released by regulatory bodies including the CDC and federal and state organizations. These policies and algorithms were followed  during the patient's care in the ED.  ____________________________________________   LABS (pertinent positives/negatives)  Labs Reviewed  CBC WITH DIFFERENTIAL/PLATELET - Abnormal; Notable for the following components:      Result Value   WBC 12.5 (*)    Neutro Abs 9.3 (*)    All other components within normal limits  BASIC METABOLIC PANEL - Abnormal; Notable for the following components:   BUN 7 (*)    Calcium 8.0 (*)    All other components within normal limits  SARS CORONAVIRUS 2 (HOSPITAL ORDER, [place] [facility])  BRAIN NATRIURETIC PEPTIDE  TROPONIN I (HIGH SENSITIVITY)   CRITICAL CARE Performed by: Laurence Aly   Total critical care time: 30 minutes  Critical care time was exclusive of separately billable procedures and treating other patients.  Critical care was necessary to treat or prevent  imminent or life-threatening deterioration.  Critical care was time spent personally by me on the following activities: development of treatment plan with patient and/or surrogate as well as nursing, discussions with consultants, evaluation of patient's response to treatment, examination of patient, obtaining history from patient or surrogate, ordering and performing treatments and interventions, ordering and review of laboratory studies, ordering and review of radiographic studies, pulse oximetry and re-evaluation of patient's condition.  RADIOLOGY Images were viewed by me  Chest x-ray IMPRESSION: No active disease. ____________________________________________   DIFFERENTIAL DIAGNOSIS   COPD, pneumonia, influenza, coronavirus, PE, pleurisy  FINAL ASSESSMENT AND PLAN  COPD exacerbation, hypoxia   Plan: The patient had presented for worsening dyspnea.  She was given duo nebs and steroids here as well as Levaquin.  Patient's labs revealed mild leukocytosis without other acute process. Patient's imaging was negative.  She was still hypoxic requiring  at least 2 L nasal cannula.  Off of oxygen she drops very quickly.  I will discuss with hospitalist for admission.   Laurence Aly, MD    Note: This note was generated in part or whole with voice recognition software. Voice recognition is usually quite accurate but there are transcription errors that can and very often do occur. I apologize for any typographical errors that were not detected and corrected.     Earleen Newport, MD 09/13/18 [phone]

## 2018-09-13 NOTE — ED Notes (Signed)
ED TO INPATIENT HANDOFF REPORT  ED Nurse Name and Phone #: Janett Billow [phone]  S Name/Age/Gender Laura Bates 68 y.o. female Room/Bed: ED11A/ED11A  Code Status   Code Status: Not on file  Home/SNF/Other Home Patient oriented to: self, place, time and situation Is this baseline? Yes   Triage Complete: Triage complete  Chief Complaint SOB  Triage Note Pt comes EMS from [facility] with chronic COPD SOB. Pt said Sob started worsening about 4 days ago. Pt afebrile with EMS. Pt was 84% on RA. Pt satting at 97% on 4L [place]. Pt able to move to bed herself. 107/55 with EMS. Pt went today for right upper lung pain.    Allergies Allergies  Allergen Reactions  . Oxycodone Itching  . Cephalexin Swelling  . Hydrocodone Other (See Comments)    Pt can take this med  . Penicillins Hives, Rash and Other (See Comments)    Other Reaction: OTHER REACTION BLEEDING THRU S    Level of Care/Admitting Diagnosis ED Disposition    ED Disposition Condition Comment   Admit  Hospital Area: [place] [[id number]]  Level of Care: Med-Surg [16]  Covid Evaluation: Confirmed COVID Negative  Diagnosis: COPD with acute exacerbation [facility]) [[id number]]  Admitting Physician: Nicholes Mango [[id number]]  Attending Physician: Nicholes Mango [[id number]]  Estimated length of stay: 3 - 4 days  Certification:: I certify this patient will need inpatient services for at least 2 midnights  PT Class (Do Not Modify): Inpatient [101]  PT Acc Code (Do Not Modify): Private [1]       B Medical/Surgery History Past Medical History:  Diagnosis Date  . Allergic state   . Anxiety   . Back pain   . COPD, mild ([place])   . Cough   . Depression   . Dysphagia   . Eosinophilic esophagitis   . Esophageal web   . GERD (gastroesophageal reflux disease)   . Grief at loss of child   . H/O shoulder surgery   . Hyperlipidemia   . Insomnia   . Palpitations   . RLS (restless legs syndrome)   . Rotator cuff tear -  Right   . Tobacco use    Past Surgical History:  Procedure Laterality Date  . ABDOMINAL HYSTERECTOMY    . benign tumor     breast  . BREAST LUMPECTOMY    . COLONOSCOPY WITH PROPOFOL N/A 10/18/2015   Procedure: COLONOSCOPY WITH PROPOFOL;  Surgeon: Lollie Sails, MD;  Location: [facility] ENDOSCOPY;  Service: Endoscopy;  Laterality: N/A;  . ESOPHAGOGASTRODUODENOSCOPY    . Right Rotator Cuff Repair Right   . SKIN CANCER EXCISION       A IV Location/Drains/Wounds Patient Lines/Drains/Airways Status   Active Line/Drains/Airways    Name:   Placement date:   Placement time:   Site:   Days:   Peripheral IV 03/11/15 Left Antecubital   03/11/15    0845    Antecubital   1282   Peripheral IV 09/13/18 Left Antecubital   09/13/18    1705    Antecubital   less than 1   Airway   10/18/15    1406     1061          Intake/Output Last 24 hours No intake or output data in the 24 hours ending 09/13/18 1917  Labs/Imaging Results for orders placed or performed during the hospital encounter of 09/13/18 (from the past 48 hour(s))  CBC with Differential     Status:  Abnormal   Collection Time: 09/13/18  5:05 PM  Result Value Ref Range   WBC 12.5 (H) 4.0 - 10.5 K/uL   RBC 4.53 3.87 - 5.11 MIL/uL   Hemoglobin 14.5 12.0 - 15.0 g/dL   HCT 45.1 36.0 - 46.0 %   MCV 99.6 80.0 - 100.0 fL   MCH 32.0 26.0 - 34.0 pg   MCHC 32.2 30.0 - 36.0 g/dL   RDW 15.1 11.5 - 15.5 %   Platelets 269 150 - 400 K/uL   nRBC 0.0 0.0 - 0.2 %   Neutrophils Relative % 75 %   Neutro Abs 9.3 (H) 1.7 - 7.7 K/uL   Lymphocytes Relative 17 %   Lymphs Abs 2.1 0.7 - 4.0 K/uL   Monocytes Relative 6 %   Monocytes Absolute 0.8 0.1 - 1.0 K/uL   Eosinophils Relative 1 %   Eosinophils Absolute 0.1 0.0 - 0.5 K/uL   Basophils Relative 1 %   Basophils Absolute 0.1 0.0 - 0.1 K/uL   Immature Granulocytes 0 %   Abs Immature Granulocytes 0.05 0.00 - 0.07 K/uL    Comment: Performed at [facility], [address] [place],  [place] [address]  Basic metabolic panel     Status: Abnormal   Collection Time: 09/13/18  5:05 PM  Result Value Ref Range   Sodium 139 135 - 145 mmol/L   Potassium 4.5 3.5 - 5.1 mmol/L    Comment: HEMOLYSIS AT THIS LEVEL MAY AFFECT RESULT   Chloride 101 98 - 111 mmol/L   CO2 32 22 - 32 mmol/L   Glucose, Bld 93 70 - 99 mg/dL   BUN 7 (L) 8 - 23 mg/dL   Creatinine, Ser 0.47 0.44 - 1.00 mg/dL   Calcium 8.0 (L) 8.9 - 10.3 mg/dL   GFR calc non Af Amer >60 >60 mL/min   GFR calc Af Amer >60 >60 mL/min   Anion gap 6 5 - 15    Comment: Performed at [facility], [address] [place], [place] [address]  Brain natriuretic peptide     Status: None   Collection Time: 09/13/18  5:05 PM  Result Value Ref Range   B Natriuretic Peptide 32.0 0.0 - 100.0 pg/mL    Comment: Performed at [facility], [place], [place] [address]  Troponin I (High Sensitivity)     Status: None   Collection Time: 09/13/18  5:05 PM  Result Value Ref Range   Troponin I (High Sensitivity) 10 <18 ng/L    Comment: (NOTE) Elevated high sensitivity troponin I (hsTnI) values and significant  changes across serial measurements may suggest ACS but many other  chronic and acute conditions are known to elevate hsTnI results.  Refer to the "Links" section for chest pain algorithms and additional  guidance. Performed at [facility], [address], [place], [place] [address]   SARS Coronavirus 2 (CEPHEID- Performed in [facility] hospital lab), Hosp Order     Status: None   Collection Time: 09/13/18  5:09 PM   Specimen: Nasopharyngeal Swab  Result Value Ref Range   SARS Coronavirus 2 NEGATIVE NEGATIVE    Comment: (NOTE) If result is NEGATIVE SARS-CoV-2 target nucleic acids are NOT DETECTED. The SARS-CoV-2 RNA is generally detectable in upper and lower  respiratory specimens during the acute phase of infection. The lowest  concentration of SARS-CoV-2 viral copies this assay can detect is  250  copies / mL. A negative result does not preclude SARS-CoV-2  infection  and should not be used as the sole basis for treatment or other  patient management decisions.  A negative result may occur with  improper specimen collection / handling, submission of specimen other  than nasopharyngeal swab, presence of viral mutation(s) within the  areas targeted by this assay, and inadequate number of viral copies  (<250 copies / mL). A negative result must be combined with clinical  observations, patient history, and epidemiological information. If result is POSITIVE SARS-CoV-2 target nucleic acids are DETECTED. The SARS-CoV-2 RNA is generally detectable in upper and lower  respiratory specimens dur ing the acute phase of infection.  Positive  results are indicative of active infection with SARS-CoV-2.  Clinical  correlation with patient history and other diagnostic information is  necessary to determine patient infection status.  Positive results do  not rule out bacterial infection or co-infection with other viruses. If result is PRESUMPTIVE POSTIVE SARS-CoV-2 nucleic acids MAY BE PRESENT.   A presumptive positive result was obtained on the submitted specimen  and confirmed on repeat testing.  While 2019 novel coronavirus  (SARS-CoV-2) nucleic acids may be present in the submitted sample  additional confirmatory testing may be necessary for epidemiological  and / or clinical management purposes  to differentiate between  SARS-CoV-2 and other Sarbecovirus currently known to infect humans.  If clinically indicated additional testing with an alternate test  methodology [phone]) is advised. The SARS-CoV-2 RNA is generally  detectable in upper and lower respiratory sp ecimens during the acute  phase of infection. The expected result is Negative. Fact Sheet for Patients:  StrictlyIdeas.no Fact Sheet for Healthcare  Providers: BankingDealers.co.za This test is not yet approved or cleared by the [place] FDA and has been authorized for detection and/or diagnosis of SARS-CoV-2 by FDA under an Emergency Use Authorization (EUA).  This EUA will remain in effect (meaning this test can be used) for the duration of the COVID-19 declaration under Section 564(b)(1) of the Act, 21 U.S.C. section 360bbb-3(b)(1), unless the authorization is terminated or revoked sooner. Performed at [facility], [address] [place], [place] [address]    Dg Chest Port 1 View  Result Date: 09/13/2018 CLINICAL DATA:  Acute shortness of breath for 4 days EXAM: PORTABLE CHEST 1 VIEW COMPARISON:  02/18/2015 and prior radiographs FINDINGS: The cardiomediastinal silhouette is unremarkable. There is no evidence of focal airspace disease, pulmonary edema, suspicious pulmonary nodule/mass, pleural effusion, or pneumothorax. No acute bony abnormalities are identified. IMPRESSION: No active disease. Electronically Signed   By: Margarette Canada M.D.   On: 09/13/2018 17:28    Pending Labs [employer] (From admission, onward)    Start     Ordered   Signed and Held  HIV antibody  Once,   R     Signed and Held   Signed and Held  Culture, sputum-assessment  Once,   R     Signed and Held          Vitals/Pain Today's Vitals   09/13/18 1730 09/13/18 1744 09/13/18 1802 09/13/18 1830  BP: (!) 115/57  108/64 (!) 93/48  Pulse: 88 91 87 96  Resp: 18 16 16 18   Temp:      TempSrc:      SpO2: 94% 94% 93% (!) 85%  Weight:      Height:      PainSc:        Isolation Precautions No active isolations  Medications Medications  ipratropium-albuterol (DUONEB) 0.5-2.5 (3) MG/3ML nebulizer solution  3 mL (3 mLs Nebulization Given 09/13/18 1740)  ipratropium-albuterol (DUONEB) 0.5-2.5 (3) MG/3ML nebulizer solution 3 mL (3 mLs Nebulization Given 09/13/18 1740)  ipratropium-albuterol (DUONEB) 0.5-2.5 (3) MG/3ML  nebulizer solution 3 mL (3 mLs Nebulization Given 09/13/18 1740)  methylPREDNISolone sodium succinate (SOLU-MEDROL) 125 mg/2 mL injection 125 mg (125 mg Intravenous Given 09/13/18 1741)  levofloxacin (LEVAQUIN) tablet 750 mg (750 mg Oral Given 09/13/18 1837)    Mobility walks with person assist Low fall risk   Focused Assessments Pulmonary Assessment Handoff:  Lung sounds: Bilateral Breath Sounds: Inspiratory wheezes, Expiratory wheezes L Breath Sounds: Inspiratory wheezes, Expiratory wheezes R Breath Sounds: Expiratory wheezes, Inspiratory wheezes O2 Device: Nasal Cannula O2 Flow Rate (L/min): 4 L/min      R Recommendations: See Admitting Provider Note  Report given to:   Additional Notes:

## 2018-09-14 LAB — BLOOD GAS, ARTERIAL
Acid-Base Excess: 6.8 mmol/L — ABNORMAL HIGH (ref 0.0–2.0)
Bicarbonate: 33.7 mmol/L — ABNORMAL HIGH (ref 20.0–28.0)
FIO2: 0.32
O2 Saturation: 91.3 %
Patient temperature: 37
pCO2 arterial: 57 mmHg — ABNORMAL HIGH (ref 32.0–48.0)
pH, Arterial: 7.38 (ref 7.350–7.450)
pO2, Arterial: 63 mmHg — ABNORMAL LOW (ref 83.0–108.0)

## 2018-09-14 LAB — EXPECTORATED SPUTUM ASSESSMENT W GRAM STAIN, RFLX TO RESP C

## 2018-09-14 LAB — FIBRIN DERIVATIVES D-DIMER (ARMC ONLY): Fibrin derivatives D-dimer (ARMC): 654.76 ng/mL (FEU) — ABNORMAL HIGH (ref 0.00–499.00)

## 2018-09-14 MED ORDER — MORPHINE SULFATE 15 MG PO TABS: 15.0000 mg | ORAL_TABLET | Freq: Three times a day (TID) | ORAL | Status: DC | PRN

## 2018-09-14 MED ORDER — ENOXAPARIN SODIUM 40 MG/0.4ML ~~LOC~~ SOLN
40.0000 mg | SUBCUTANEOUS | Status: DC
  Filled 2018-09-14 (×2): qty 0.4

## 2018-09-14 MED ORDER — BUDESONIDE 0.5 MG/2ML IN SUSP
0.5000 mg | Freq: Two times a day (BID) | RESPIRATORY_TRACT | Status: DC
  Administered 2018-09-14 – 2018-09-16 (×4): 0.5 mg via RESPIRATORY_TRACT
  Filled 2018-09-14 (×4): qty 2

## 2018-09-14 MED ORDER — ADULT MULTIVITAMIN W/MINERALS CH
1.0000 | ORAL_TABLET | Freq: Every day | ORAL | Status: DC
  Administered 2018-09-15 – 2018-09-16 (×2): 1 via ORAL
  Filled 2018-09-14 (×2): qty 1

## 2018-09-14 MED ORDER — GUAIFENESIN ER 600 MG PO TB12
600.0000 mg | ORAL_TABLET | Freq: Two times a day (BID) | ORAL | Status: DC
  Administered 2018-09-14 – 2018-09-16 (×5): 600 mg via ORAL
  Filled 2018-09-14 (×5): qty 1

## 2018-09-14 MED ORDER — ENSURE ENLIVE PO LIQD
237.0000 mL | Freq: Two times a day (BID) | ORAL | Status: DC
  Administered 2018-09-14 – 2018-09-16 (×5): 237 mL via ORAL

## 2018-09-14 MED ORDER — SODIUM CHLORIDE 0.9% FLUSH
3.0000 mL | INTRAVENOUS | Status: DC | PRN
  Administered 2018-09-14: 16:00:00 3 mL via INTRAVENOUS

## 2018-09-14 NOTE — Progress Notes (Signed)
Initial Nutrition Assessment  RD working remotely.  DOCUMENTATION CODES:   Not applicable  INTERVENTION:  Provide Ensure Enlive po BID, each supplement provides 350 kcal and 20 grams of protein. Patient prefers chocolate.  Provide daily MVI.  NUTRITION DIAGNOSIS:   Inadequate oral intake related to decreased appetite as evidenced by per patient/family report.  GOAL:   Patient will meet greater than or equal to 90% of their needs  MONITOR:   PO intake, Supplement acceptance, Labs, Weight trends, I & O's  REASON FOR ASSESSMENT:   Consult COPD Protocol  ASSESSMENT:   68 year old female with PMHx of COPD, esophageal web, EoE, back pain, RLS, GERD, anxiety, depression, HLD, dysphagia admitted with COPD exacerbation.   Spoke with patient over the phone. She reports she has had a decreased appetite for the past two months related to back pain. She tries to eat 2 meals per day at home but they are smaller meals. She reports her husband prepares her food and she eats mainly vegetables at meals. She has not previously tried ONS but is amenable to trying them to help meet calorie/protein needs. Discussed increased needs for calories and protein from COPD.  Patient reports her UBW was 135 lbs (61.4 kg) and that over the past 2-3 years she has lost down to 106 lbs. Per chart patient was 60.3 kg in 02/2015. She is now 48 kg (105.82 lbs).   Medications reviewed and include: azithromycin, ferrous sulfate 325 mg BID, gabapentin, Solu-Medrol, pantoprazole.  Labs reviewed: BUN 7.  Patient is at risk for malnutrition. Unable to determine if she meets criteria without completing NFPE.  NUTRITION - FOCUSED PHYSICAL EXAM:  Unable to complete at this time.  Diet Order:   Diet Order            Diet regular Room service appropriate? Yes; Fluid consistency: Thin  Diet effective now             EDUCATION NEEDS:   Education needs have been addressed  Skin:  Skin Assessment: Reviewed RN  Assessment  Last BM:  09/06/2018 per chart  Height:   Ht Readings from Last 1 Encounters:  09/13/18 5\' 2"  (1.575 m)   Weight:   Wt Readings from Last 1 Encounters:  09/13/18 48 kg   Ideal Body Weight:  50 kg  BMI:  Body mass index is 19.35 kg/m.  Estimated Nutritional Needs:   Kcal:  1440-1680  Protein:  70-80 grams  Fluid:  1.4-1.6 L/day  Willey Blade, MS, RD, LDN Office: [phone] Pager: [phone] After Hours/Weekend Pager: [phone]

## 2018-09-14 NOTE — Plan of Care (Signed)

## 2018-09-14 NOTE — Progress Notes (Signed)
[place] at [facility]                                                                                                                                                                                  Patient Demographics   Laura Bates, is a 68 y.o. female, DOB - 1950/05/01, YCX:[id number]  Admit date - 09/13/2018   Admitting Physician Nicholes Mango, MD  Outpatient Primary MD for the patient is Clarisse Gouge, MD   LOS - 1  Subjective: Patient presented with shortness of breath, patient states that she feels okay and wanted to go home when the nurse took her off oxygen sats dropped to 76 she is currently on 4 L of oxygen   Review of Systems:   CONSTITUTIONAL: No documented fever. No fatigue, weakness. No weight gain, no weight loss.  EYES: No blurry or double vision.  ENT: No tinnitus. No postnasal drip. No redness of the oropharynx.  RESPIRATORY: No cough, no wheeze, no hemoptysis.  Positive dyspnea.  CARDIOVASCULAR: No chest pain. No orthopnea. No palpitations. No syncope.  GASTROINTESTINAL: No nausea, no vomiting or diarrhea. No abdominal pain. No melena or hematochezia.  GENITOURINARY: No dysuria or hematuria.  ENDOCRINE: No polyuria or nocturia. No heat or cold intolerance.  HEMATOLOGY: No anemia. No bruising. No bleeding.  INTEGUMENTARY: No rashes. No lesions.  MUSCULOSKELETAL: No arthritis. No swelling. No gout.  NEUROLOGIC: No numbness, tingling, or ataxia. No seizure-type activity.  PSYCHIATRIC: No anxiety. No insomnia. No ADD.    Vitals:   Vitals:   09/14/18 0743 09/14/18 0744 09/14/18 0759 09/14/18 1030  BP: 108/63     Pulse: 81 82 82   Resp: 20 20 20    Temp: 98.6 F (37 C)     TempSrc: Oral     SpO2: 95% 90% (!) 85% 91%  Weight:      Height:        Wt Readings from Last 3 Encounters:  09/13/18 48 kg  09/13/18 49 kg  11/15/16 54.4 kg    No intake or output data in the 24 hours ending 09/14/18 1236  Physical Exam:    GENERAL: Pleasant-appearing in no apparent distress.  HEAD, EYES, EARS, NOSE AND THROAT: Atraumatic, normocephalic. Extraocular muscles are intact. Pupils equal and reactive to light. Sclerae anicteric. No conjunctival injection. No oro-pharyngeal erythema.  NECK: Supple. There is no jugular venous distention. No bruits, no lymphadenopathy, no thyromegaly.  HEART: Regular rate and rhythm,. No murmurs, no rubs, no clicks.  LUNGS: Limited or no air movement with diminished breath sounds no rales or rhonchi. No wheezes.  ABDOMEN: Soft, flat, nontender, nondistended. Has good bowel sounds. No hepatosplenomegaly  appreciated.  EXTREMITIES: No evidence of any cyanosis, clubbing, or peripheral edema.  +2 pedal and radial pulses bilaterally.  NEUROLOGIC: The patient is alert, awake, and oriented x3 with no focal motor or sensory deficits appreciated bilaterally.  SKIN: Moist and warm with no rashes appreciated.  Psych: Not anxious, depressed LN: No inguinal LN enlargement    Antibiotics   Anti-infectives (From admission, onward)   Start     Dose/Rate Route Frequency Ordered Stop   09/15/18 1900  azithromycin (ZITHROMAX) tablet 500 mg     500 mg Oral Daily 09/13/18 2025 09/19/18 1859   09/14/18 1900  azithromycin (ZITHROMAX) 500 mg in sodium chloride 0.9 % 250 mL IVPB     500 mg 250 mL/hr over 60 Minutes Intravenous Every 24 hours 09/13/18 2025 09/15/18 1859   09/13/18 1830  levofloxacin (LEVAQUIN) tablet 750 mg     750 mg Oral  Once 09/13/18 1820 09/13/18 1837      Medications   Scheduled Meds: . [START ON 09/15/2018] azithromycin  500 mg Oral Daily  . budesonide (PULMICORT) nebulizer solution  0.5 mg Nebulization BID  . buPROPion  100 mg Oral BID  . cyclobenzaprine  20 mg Oral QHS  . DULoxetine  60 mg Oral Daily  . feeding supplement (ENSURE ENLIVE)  237 mL Oral BID BM  . ferrous sulfate  325 mg Oral BID  . gabapentin  300 mg Oral TID  . guaiFENesin  600 mg Oral BID  .  ipratropium-albuterol  3 mL Nebulization Q6H  . methylPREDNISolone (SOLU-MEDROL) injection  60 mg Intravenous Q8H   Followed by  . [START ON 09/15/2018] predniSONE  40 mg Oral Q breakfast  . [START ON 09/15/2018] multivitamin with minerals  1 tablet Oral Daily  . pantoprazole  40 mg Oral Daily  . primidone  250 mg Oral QID  . rosuvastatin  10 mg Oral Daily   Continuous Infusions: . azithromycin     PRN Meds:.albuterol, morphine, traZODone   Data Review:   Micro Results Recent Results (from the past 240 hour(s))  SARS Coronavirus 2 (CEPHEID- Performed in [place] hospital lab), Hosp Order     Status: None   Collection Time: 09/13/18  5:09 PM   Specimen: Nasopharyngeal Swab  Result Value Ref Range Status   SARS Coronavirus 2 NEGATIVE NEGATIVE Final    Comment: (NOTE) If result is NEGATIVE SARS-CoV-2 target nucleic acids are NOT DETECTED. The SARS-CoV-2 RNA is generally detectable in upper and lower  respiratory specimens during the acute phase of infection. The lowest  concentration of SARS-CoV-2 viral copies this assay can detect is 250  copies / mL. A negative result does not preclude SARS-CoV-2 infection  and should not be used as the sole basis for treatment or other  patient management decisions.  A negative result may occur with  improper specimen collection / handling, submission of specimen other  than nasopharyngeal swab, presence of viral mutation(s) within the  areas targeted by this assay, and inadequate number of viral copies  (<250 copies / mL). A negative result must be combined with clinical  observations, patient history, and epidemiological information. If result is POSITIVE SARS-CoV-2 target nucleic acids are DETECTED. The SARS-CoV-2 RNA is generally detectable in upper and lower  respiratory specimens dur ing the acute phase of infection.  Positive  results are indicative of active infection with SARS-CoV-2.  Clinical  correlation with patient history  and other diagnostic information is  necessary to determine patient infection status.  Positive  results do  not rule out bacterial infection or co-infection with other viruses. If result is PRESUMPTIVE POSTIVE SARS-CoV-2 nucleic acids MAY BE PRESENT.   A presumptive positive result was obtained on the submitted specimen  and confirmed on repeat testing.  While 2019 novel coronavirus  (SARS-CoV-2) nucleic acids may be present in the submitted sample  additional confirmatory testing may be necessary for epidemiological  and / or clinical management purposes  to differentiate between  SARS-CoV-2 and other Sarbecovirus currently known to infect humans.  If clinically indicated additional testing with an alternate test  methodology [phone]) is advised. The SARS-CoV-2 RNA is generally  detectable in upper and lower respiratory sp ecimens during the acute  phase of infection. The expected result is Negative. Fact Sheet for Patients:  StrictlyIdeas.no Fact Sheet for Healthcare Providers: BankingDealers.co.za This test is not yet approved or cleared by the [place] FDA and has been authorized for detection and/or diagnosis of SARS-CoV-2 by FDA under an Emergency Use Authorization (EUA).  This EUA will remain in effect (meaning this test can be used) for the duration of the COVID-19 declaration under Section 564(b)(1) of the Act, 21 U.S.C. section 360bbb-3(b)(1), unless the authorization is terminated or revoked sooner. Performed at [facility], [address] [place], [place] [address]   Culture, sputum-assessment     Status: None   Collection Time: 09/14/18  7:56 AM   Specimen: Sputum  Result Value Ref Range Status   Specimen Description SPUTUM  Final   Special Requests NONE  Final   Sputum evaluation   Final    THIS SPECIMEN IS ACCEPTABLE FOR SPUTUM CULTURE Performed at [facility], [address],  [place], [place] [address]    Report Status 09/14/2018 FINAL  Final    Radiology Reports Dg Chest Port 1 View  Result Date: 09/13/2018 CLINICAL DATA:  Acute shortness of breath for 4 days EXAM: PORTABLE CHEST 1 VIEW COMPARISON:  02/18/2015 and prior radiographs FINDINGS: The cardiomediastinal silhouette is unremarkable. There is no evidence of focal airspace disease, pulmonary edema, suspicious pulmonary nodule/mass, pleural effusion, or pneumothorax. No acute bony abnormalities are identified. IMPRESSION: No active disease. Electronically Signed   By: Margarette Canada M.D.   On: 09/13/2018 17:28     CBC Recent Labs  Lab 09/13/18 1705  WBC 12.5*  HGB 14.5  HCT 45.1  PLT 269  MCV 99.6  MCH 32.0  MCHC 32.2  RDW 15.1  LYMPHSABS 2.1  MONOABS 0.8  EOSABS 0.1  BASOSABS 0.1    Chemistries  Recent Labs  Lab 09/13/18 1705  NA 139  K 4.5  CL 101  CO2 32  GLUCOSE 93  BUN 7*  CREATININE 0.47  CALCIUM 8.0*   ------------------------------------------------------------------------------------------------------------------ estimated creatinine clearance is 51 mL/min (by C-G formula based on SCr of 0.47 mg/dL). ------------------------------------------------------------------------------------------------------------------ No results for input(s): HGBA1C in the last 72 hours. ------------------------------------------------------------------------------------------------------------------ No results for input(s): CHOL, HDL, LDLCALC, TRIG, CHOLHDL, LDLDIRECT in the last 72 hours. ------------------------------------------------------------------------------------------------------------------ No results for input(s): TSH, T4TOTAL, T3FREE, THYROIDAB in the last 72 hours.  Invalid input(s): FREET3 ------------------------------------------------------------------------------------------------------------------ No results for input(s): VITAMINB12, FOLATE, FERRITIN, TIBC, IRON, RETICCTPCT in  the last 72 hours.  Coagulation profile No results for input(s): INR, PROTIME in the last 168 hours.  No results for input(s): DDIMER in the last 72 hours.  Cardiac Enzymes No results for input(s): CKMB, TROPONINI, MYOGLOBIN in the last 168 hours.  Invalid input(s): CK ------------------------------------------------------------------------------------------------------------------ Invalid input(s): POCBNP    Assessment &  Plan   #Acute hypoxemic respiratory failure from COPD exacerbation Continue Solu-Medrol I will add Pulmicort to current treatment Continue nebulizer therapy Continue oral antibiotics I will ask pulmonary to see She has never had PFTs she will need these as outpatient  #Insomnia continue home medication trazodone  #Chronic history of anxiety continue her home medication Cymbalta  #GERD PPI  #Tobacco abuse disorder patient was counseled yesterday          Consults pulmonary  DVT Prophylaxis  Lovenox  Lab Results  Component Value Date   PLT 269 09/13/2018     Time Spent in minutes   35 minutes  Greater than 50% of time spent in care coordination and counseling patient regarding the condition and plan of care.   Dustin Flock M.D on 09/14/2018 at 12:36 PM  Between 7am to 6pm - Pager - [phone]  After 6pm go to [email or web address] - Proofreader  [employer]   Office  [phone]

## 2018-09-14 NOTE — Progress Notes (Signed)
Pt asking about morphine for pain. Home med list reviewed and noted listed. No pain medications ordered. Message sent to MD with pt request for morphine order. Pt observed reaching into pocketbook stating, " I haven't taken my gabapentin"; pt advised I just gave it to her. Noted sound like medication bottles with pt presenting bag of medications in her purse including morphine IR 15 mg, gabapentin, cymbalta, trazadone and others. Medications removed from pt with pt agreeable with medication education done and transported to pharmacy with inventory completed. She then ask about her "can I have my metadone?" when pt inquiry done on this, she stated, "oh, I use to take it."  Advised communication has been sent to MD with her pain medication request.

## 2018-09-14 NOTE — Progress Notes (Signed)
SEE PT note with desats with activity. Pt has varying pulse ox readings even at rest. Reports she does feel better since she came into the hospital. Noted some limited memory recall with time/situation. 02 continued with extension tubing added for going to the BR. Sputum specimen with culture collected and in process.

## 2018-09-14 NOTE — Progress Notes (Addendum)
Patient Saturations on 4 L [place] at Rest = 100%  Patient Saturations on 4 L [place] when stood dropped to 81% with recovery to 96 %  Patient Saturations on 4 Liters of oxygen  [place] while Ambulating dropped as low as  78%.   Pt held 97% to 91% while walking from room around nurses station. Pulse began to rise and reach 126 at this point spo2 sensor dropped from 95% to 78%. Pulse recovered to 86 following SpO2 read of 78.  Pt now resting in bed SpO2 100 Pulse of 77

## 2018-09-14 NOTE — Progress Notes (Signed)
   09/14/18 1900  Clinical Encounter Type  Visited With Patient  Visit Type Initial;Spiritual support;Other (Comment)  Referral From Nurse  Spiritual Encounters  Spiritual Needs Brochure;Prayer;Emotional;Other (Comment)  Stress Factors  Patient Stress Factors Loss  Advance Directives (For Healthcare)  Does Patient Have a Medical Advance Directive? No  Would patient like information on creating a medical advance directive? Yes (Inpatient - patient defers creating a medical advance directive at this time - Information given)

## 2018-09-14 NOTE — Consult Note (Signed)
Pulmonary Medicine          Date: 09/14/2018,   MRN# [id number] Laura Bates [birth year]     AdmissionWeight: 48 kg                 CurrentWeight: 48 kg      CHIEF COMPLAINT:   Acute Exacerbation of COPD with acute hypoxemic respiratory failure   HISTORY OF PRESENT ILLNESS   This is a pleasant female with hx of chronic pain syndrome from unclear inflammatory condition of lower extermities, she has hx of anxiety, depression, GERD, RLS, chronic tobacco abuse with active smoking status and COPD. She reports that she is bedridden due to chronic pain syndrome. She sees pain clinic doctor and is on chronic narcotics. She currently uses prn albuterol but states her breathing status is worsening with recent onset of high volume phlegm production appx 5 days prior to admission. She felt chest pain and discomfort and was worried about development of pneumonia. She denies being ill with flu like illlness, denies sick contacts, denies fevers, denies chronic steroid/prednisone use.  She is currently on 3-4L/min [place] O2 therapy and states she has not needed O2 in the past but admits that she likely needed it before but was unable to appreciate SOB due to bedridded status at home. Her activity includes walking to kitchen and bathroom from bed. She admits to appx 30lb weight loss over past 1-2 years due to loss of appetite but denies lumps under axillary/groin area and denies nocturnal diaphoresis.    PAST MEDICAL HISTORY   Past Medical History:  Diagnosis Date  . Allergic state   . Anxiety   . Back pain   . COPD, mild ([place])   . Cough   . Depression   . Dysphagia   . Eosinophilic esophagitis   . Esophageal web   . GERD (gastroesophageal reflux disease)   . Grief at loss of child   . H/O shoulder surgery   . Hyperlipidemia   . Insomnia   . Palpitations   . RLS (restless legs syndrome)   . Rotator cuff tear - Right   . Tobacco use      SURGICAL HISTORY   Past Surgical  History:  Procedure Laterality Date  . ABDOMINAL HYSTERECTOMY    . benign tumor     breast  . BREAST LUMPECTOMY    . COLONOSCOPY WITH PROPOFOL N/A 10/18/2015   Procedure: COLONOSCOPY WITH PROPOFOL;  Surgeon: Lollie Sails, MD;  Location: [facility] ENDOSCOPY;  Service: Endoscopy;  Laterality: N/A;  . ESOPHAGOGASTRODUODENOSCOPY    . Right Rotator Cuff Repair Right   . SKIN CANCER EXCISION       FAMILY HISTORY   Family History  Problem Relation Age of Onset  . Heart attack Father   . Diabetes Father   . Heart Problems Maternal Grandmother      SOCIAL HISTORY   Social History   Tobacco Use  . Smoking status: Current Every Day Smoker    Packs/day: 1.00    Years: 45.00    Pack years: 45.00    Types: Cigarettes    Start date: 10/19/1974  . Smokeless tobacco: Never Used  Substance Use Topics  . Alcohol use: No    Alcohol/week: 0.0 standard drinks  . Drug use: No     MEDICATIONS    Home Medication:    Current Medication:  Current Facility-Administered Medications:  .  albuterol (PROVENTIL) (2.5 MG/3ML) 0.083% nebulizer solution 2.5 mg, 2.5  mg, Nebulization, Q2H PRN, Gouru, Aruna, MD .  azithromycin (ZITHROMAX) 500 mg in sodium chloride 0.9 % 250 mL IVPB, 500 mg, Intravenous, Q24H **FOLLOWED BY** [START ON 09/15/2018] azithromycin (ZITHROMAX) tablet 500 mg, 500 mg, Oral, Daily, Gouru, Aruna, MD .  budesonide (PULMICORT) nebulizer solution 0.5 mg, 0.5 mg, Nebulization, BID, Dustin Flock, MD .  buPROPion [facility] SR) 12 hr tablet 100 mg, 100 mg, Oral, BID, Gouru, Aruna, MD, 100 mg at 09/14/18 0747 .  cyclobenzaprine (FLEXERIL) tablet 20 mg, 20 mg, Oral, QHS, Gouru, Aruna, MD, 20 mg at 09/13/18 2357 .  DULoxetine (CYMBALTA) DR capsule 60 mg, 60 mg, Oral, Daily, Gouru, Aruna, MD, 60 mg at 09/14/18 0748 .  feeding supplement (ENSURE ENLIVE) (ENSURE ENLIVE) liquid 237 mL, 237 mL, Oral, BID BM, Dustin Flock, MD, 237 mL at 09/14/18 1259 .  ferrous sulfate tablet 325 mg,  325 mg, Oral, BID, Gouru, Aruna, MD, 325 mg at 09/14/18 0749 .  gabapentin (NEURONTIN) capsule 300 mg, 300 mg, Oral, TID, Gouru, Aruna, MD, 300 mg at 09/14/18 0748 .  guaiFENesin (MUCINEX) 12 hr tablet 600 mg, 600 mg, Oral, BID, Dustin Flock, MD, 600 mg at 09/14/18 0943 .  ipratropium-albuterol (DUONEB) 0.5-2.5 (3) MG/3ML nebulizer solution 3 mL, 3 mL, Nebulization, Q6H, Gouru, Aruna, MD, 3 mL at 09/14/18 0727 .  methylPREDNISolone sodium succinate (SOLU-MEDROL) 125 mg/2 mL injection 60 mg, 60 mg, Intravenous, Q8H, 60 mg at 09/14/18 0751 **FOLLOWED BY** [START ON 09/15/2018] predniSONE (DELTASONE) tablet 40 mg, 40 mg, Oral, Q breakfast, Gouru, Aruna, MD .  morphine (MSIR) tablet 15 mg, 15 mg, Oral, Q4H PRN, Gouru, Aruna, MD, 15 mg at 09/13/18 2357 .  [START ON 09/15/2018] multivitamin with minerals tablet 1 tablet, 1 tablet, Oral, Daily, Dustin Flock, MD .  pantoprazole (PROTONIX) EC tablet 40 mg, 40 mg, Oral, Daily, Gouru, Aruna, MD, 40 mg at 09/14/18 0750 .  primidone (MYSOLINE) tablet 250 mg, 250 mg, Oral, QID, Gouru, Aruna, MD, 250 mg at 09/14/18 1326 .  rosuvastatin (CRESTOR) tablet 10 mg, 10 mg, Oral, Daily, Gouru, Aruna, MD, 10 mg at 09/14/18 0749 .  traZODone (DESYREL) tablet 150 mg, 150 mg, Oral, QHS PRN, Gouru, Aruna, MD, 150 mg at 09/13/18 2356    ALLERGIES   Oxycodone, Cephalexin, Hydrocodone, and Penicillins     REVIEW OF SYSTEMS    Review of Systems:  Gen:  Denies  fever, sweats, chills weigh loss  HEENT: Denies blurred vision, double vision, ear pain, eye pain, hearing loss, nose bleeds, sore throat Cardiac:  No dizziness, chest pain or heaviness, chest tightness,edema Resp:   Denies cough or sputum porduction, shortness of breath,wheezing, hemoptysis,  Gi: Denies swallowing difficulty, stomach pain, nausea or vomiting, diarrhea, constipation, bowel incontinence Gu:  Denies bladder incontinence, burning urine Ext:   Denies Joint pain, stiffness or swelling Skin:  Denies  skin rash, easy bruising or bleeding or hives Endoc:  Denies polyuria, polydipsia , polyphagia or weight change Psych:   Denies depression, insomnia or hallucinations   Other:  All other systems negative   VS: BP 108/63 (BP Location: Right Arm)   Pulse 82   Temp 98.6 F (37 C) (Oral)   Resp 20   Ht 5\' 2"  (1.575 m)   Wt 48 kg   LMP  (LMP Unknown)   SpO2 91%   BMI 19.35 kg/m      PHYSICAL EXAM    GENERAL:NAD, no fevers, chills, no weakness no fatigue HEAD: Normocephalic, atraumatic.  EYES: Pupils equal, round, reactive  to light. Extraocular muscles intact. No scleral icterus.  MOUTH: Moist mucosal membrane. Dentition intact. No abscess noted.  EAR, NOSE, THROAT: Clear without exudates. No external lesions.  NECK: Supple. No thyromegaly. No nodules. No JVD.  PULMONARY: mild rhonchorous breath sounds bilaterally  CARDIOVASCULAR: S1 and S2. Regular rate and rhythm. No murmurs, rubs, or gallops. No edema. Pedal pulses 2+ bilaterally.  GASTROINTESTINAL: Soft, nontender, nondistended. No masses. Positive bowel sounds. No hepatosplenomegaly.  MUSCULOSKELETAL: No swelling, clubbing, or edema. Range of motion full in all extremities.  NEUROLOGIC: Cranial nerves II through XII are intact. No gross focal neurological deficits. Sensation intact. Reflexes intact.  SKIN: No ulceration, lesions, rashes, or cyanosis. Skin warm and dry. Turgor intact.  PSYCHIATRIC: Mood, affect within normal limits. The patient is awake, alert and oriented x 3. Insight, judgment intact.       IMAGING    Dg Chest Port 1 View  Result Date: 09/13/2018 CLINICAL DATA:  Acute shortness of breath for 4 days EXAM: PORTABLE CHEST 1 VIEW COMPARISON:  02/18/2015 and prior radiographs FINDINGS: The cardiomediastinal silhouette is unremarkable. There is no evidence of focal airspace disease, pulmonary edema, suspicious pulmonary nodule/mass, pleural effusion, or pneumothorax. No acute bony abnormalities are  identified. IMPRESSION: No active disease. Electronically Signed   By: Margarette Canada M.D.   On: 09/13/2018 17:28      ASSESSMENT/PLAN   Acute hypoxemic respiratory failure  - Due to Acute COPD exacerbation   - She is currently on solumedrol 60 q8h  - she is on zithromax for antimicrobial therapy   - continue DuoNeb q6h  - IS at bedside   - discussed possible need for O2 concentrator for home   - made appt for outpatient follow up in pulmonary clinic, will need full PFTs and inhaler regimen optimization    -discussed with Dr Posey Pronto - appreciate collaboration   - exertional walk with RN today to document hypoxmia and qualify for O2 therapy to optimize for DC home   Tobacco abuse  - counseling provided for smoking cessation today >10 min     Lung cancer screening   - high pretest probability due to lifelong smoking and >30 lb weight loss in past1-2 years    - will discuss Low dose CT chest once in chronic stable state      Thank you for allowing me to participate in the care of this patient.    Patient/Family are satisfied with care plan and all questions have been answered.  This document was prepared using Dragon voice recognition software and may include unintentional dictation errors.     Ottie Glazier, M.D.  Division of [place]

## 2018-09-14 NOTE — Evaluation (Signed)
Physical Therapy Evaluation Patient Details Name: Laura Bates MRN: [id number] DOB: April 07, 1950 Today's Date: 09/14/2018   History of Present Illness  Laura Bates comes to [facility] on 7/25 c SOB, SpO2 in 80s%, now requring 4L. PMH: COPD, depression, current smoker, HLD, chronic lumbar spine related leg pain. Pt reports 2 recent falls, which she attributes to her pain medication.  Clinical Impression  Pt admitted with above diagnosis. Pt currently with functional limitations due to the deficits listed below (see "PT Problem List"). Upon entry, pt in bed, awake and agreeable to participate. The pt is alert and oriented x4, pleasant, conversational, and generally a good historian. Pt saturated WFL on 2.5L upon entry but desats on 2L ambulating, without recovery on 2L or 4L, then after recovery on 6L is able to AMB on 6L back to room >156ft with SpO2 91% at end. Functional mobility assessment demonstrates increased effort/time requirements, poor tolerance, and need for physical assistance, whereas the patient performed these at a higher level of independence PTA. Pt has no subjective clues of desaturation problem, denying dizziness, SOB or other symptomology. Pt will benefit from skilled PT intervention to increase independence and safety with basic mobility in preparation for discharge to the venue listed below.       Follow Up Recommendations Home health PT    Equipment Recommendations  None recommended by PT    Recommendations for Other Services       Precautions / Restrictions Precautions Precautions: Fall Restrictions Weight Bearing Restrictions: No      Mobility  Bed Mobility Overal bed mobility: Modified Independent                Transfers Overall transfer level: Modified independent Equipment used: None             General transfer comment: LOB inititally with independent recovery  Ambulation/Gait   Gait Distance (Feet): 240 Feet Assistive device: None Gait  Pattern/deviations: Step-through pattern;WFL(Within Functional Limits);Staggering right;Staggering left     General Gait Details: 1-2 LOB with independent recovery; desaturation on 2L without recovery on 4L, then maintains sats WFL on 6L for return to room.  Stairs            Wheelchair Mobility    Modified Rankin (Stroke Patients Only)       Balance Overall balance assessment: Mild deficits observed, not formally tested;Modified Independent                                           Pertinent Vitals/Pain Pain Assessment: No/denies pain    Home Living Family/patient expects to be discharged to:: Private residence Living Arrangements: Spouse/significant other;Other relatives Available Help at Discharge: Family Type of Home: Apartment Home Access: Level entry     Home Layout: One level Home Equipment: None      Prior Function Level of Independence: Independent         Comments: No needs for DME or O2     Hand Dominance        Extremity/Trunk Assessment   Upper Extremity Assessment Upper Extremity Assessment: Overall WFL for tasks assessed;Generalized weakness    Lower Extremity Assessment Lower Extremity Assessment: Overall WFL for tasks assessed;Generalized weakness       Communication   Communication: No difficulties  Cognition Arousal/Alertness: Awake/alert Behavior During Therapy: WFL for tasks assessed/performed Overall Cognitive Status: Within Functional Limits for tasks assessed  General Comments      Exercises     Assessment/Plan    PT Assessment Patient needs continued PT services  PT Problem List Decreased strength;Decreased activity tolerance;Decreased balance;Decreased mobility;Decreased knowledge of precautions;Decreased safety awareness       PT Treatment Interventions Balance training;Gait training;Stair training;Functional mobility  training;Therapeutic activities;Therapeutic exercise;Patient/family education    PT Goals (Current goals can be found in the Care Plan section)  Acute Rehab PT Goals Patient Stated Goal: be rid of O2 for DC to home PT Goal Formulation: With patient Time For Goal Achievement: 09/28/18 Potential to Achieve Goals: Good    Frequency Min 2X/week   Barriers to discharge        Co-evaluation               AM-PAC PT "6 Clicks" Mobility  Outcome Measure Help needed turning from your back to your side while in a flat bed without using bedrails?: A Little Help needed moving from lying on your back to sitting on the side of a flat bed without using bedrails?: A Little Help needed moving to and from a bed to a chair (including a wheelchair)?: A Little Help needed standing up from a chair using your arms (e.g., wheelchair or bedside chair)?: A Little Help needed to walk in hospital room?: A Little Help needed climbing 3-5 steps with a railing? : A Little 6 Click Score: 18    End of Session Equipment Utilized During Treatment: Gait belt;Oxygen Activity Tolerance: Treatment limited secondary to medical complications (Comment);Patient tolerated treatment well(requires 6L for AMB at this time) Patient left: in bed;with call bell/phone within reach;with chair alarm set Nurse Communication: Mobility status PT Visit Diagnosis: Difficulty in walking, not elsewhere classified (R26.2);Other abnormalities of gait and mobility (R26.89);Unsteadiness on feet (R26.81)    Time: 4854-6270 PT Time Calculation (min) (ACUTE ONLY): 17 min   Charges:   PT Evaluation $PT Eval Moderate Complexity: 1 Mod PT Treatments $Therapeutic Exercise: 8-22 mins        12:45 PM, 09/14/18 [name], PT, DPT Physical Therapist - [facility]  [phone] ([place])   [name] C 09/14/2018, 12:41 PM

## 2018-09-14 NOTE — Progress Notes (Signed)
Pt assessed for pain and to give MSIR. Pt sound asleep lying in her left side with eyes closed with easy,even respirations, color pink.  Will await to reassess pain when pt awakes.

## 2018-09-15 ENCOUNTER — Inpatient Hospital Stay: Payer: Medicare Other

## 2018-09-15 LAB — BASIC METABOLIC PANEL
Anion gap: 7 (ref 5–15)
BUN: 7 mg/dL — ABNORMAL LOW (ref 8–23)
CO2: 32 mmol/L (ref 22–32)
Calcium: 8.1 mg/dL — ABNORMAL LOW (ref 8.9–10.3)
Chloride: 101 mmol/L (ref 98–111)
Creatinine, Ser: 0.41 mg/dL — ABNORMAL LOW (ref 0.44–1.00)
GFR calc Af Amer: >60 mL/min (ref >60)
GFR calc non Af Amer: >60 mL/min (ref >60)
Glucose, Bld: 107 mg/dL — ABNORMAL HIGH (ref 70–99)
Potassium: 4.2 mmol/L (ref 3.5–5.1)
Sodium: 140 mmol/L (ref 135–145)

## 2018-09-15 LAB — CBC
HCT: 40.2 % (ref 36.0–46.0)
Hemoglobin: 12.9 g/dL (ref 12.0–15.0)
MCH: 31.6 pg (ref 26.0–34.0)
MCHC: 32.1 g/dL (ref 30.0–36.0)
MCV: 98.5 fL (ref 80.0–100.0)
Platelets: 242 10*3/uL (ref 150–400)
RBC: 4.08 MIL/uL (ref 3.87–5.11)
RDW: 15.3 % (ref 11.5–15.5)
WBC: 13 10*3/uL — ABNORMAL HIGH (ref 4.0–10.5)
nRBC: 0 % (ref 0.0–0.2)

## 2018-09-15 LAB — PROCALCITONIN: Procalcitonin: <0.1 ng/mL

## 2018-09-15 MED ORDER — SODIUM CHLORIDE 0.9 % IV SOLN
INTRAVENOUS | Status: AC
  Administered 2018-09-15: 13:00:00 via INTRAVENOUS

## 2018-09-15 MED ORDER — MORPHINE SULFATE 15 MG PO TABS
15.0000 mg | ORAL_TABLET | Freq: Three times a day (TID) | ORAL | Status: DC | PRN
  Administered 2018-09-15 – 2018-09-16 (×3): 15 mg via ORAL
  Filled 2018-09-15 (×3): qty 1

## 2018-09-15 MED ORDER — IPRATROPIUM-ALBUTEROL 0.5-2.5 (3) MG/3ML IN SOLN
3.0000 mL | Freq: Three times a day (TID) | RESPIRATORY_TRACT | Status: DC
  Administered 2018-09-15 – 2018-09-16 (×5): 3 mL via RESPIRATORY_TRACT
  Filled 2018-09-15 (×4): qty 3

## 2018-09-15 MED ORDER — IOHEXOL 350 MG/ML SOLN
75.0000 mL | Freq: Once | INTRAVENOUS | Status: AC | PRN
  Administered 2018-09-15: 10:00:00 75 mL via INTRAVENOUS

## 2018-09-15 NOTE — Progress Notes (Signed)
Pulmonary Medicine          Date: 09/15/2018,   MRN# [id number] Laura Bates May 05, 1950     AdmissionWeight: 48 kg                 CurrentWeight: 48 kg      CHIEF COMPLAINT:   Acute Exacerbation of COPD with acute hypoxemic respiratory failure   SUBJECTIVE    Patient states she feels same as yesterday but started to bring up phlegm which she was unable to do before.   Remains on 4L/min [place] O2 therapy  Contacted [employer] regarding O2 concentrator for home use.   PAST MEDICAL HISTORY   Past Medical History:  Diagnosis Date  . Allergic state   . Anxiety   . Back pain   . COPD, mild ([place])   . Cough   . Depression   . Dysphagia   . Eosinophilic esophagitis   . Esophageal web   . GERD (gastroesophageal reflux disease)   . Grief at loss of child   . H/O shoulder surgery   . Hyperlipidemia   . Insomnia   . Palpitations   . RLS (restless legs syndrome)   . Rotator cuff tear - Right   . Tobacco use      SURGICAL HISTORY   Past Surgical History:  Procedure Laterality Date  . ABDOMINAL HYSTERECTOMY    . benign tumor     breast  . BREAST LUMPECTOMY    . COLONOSCOPY WITH PROPOFOL N/A 10/18/2015   Procedure: COLONOSCOPY WITH PROPOFOL;  Surgeon: Lollie Sails, MD;  Location: [facility] ENDOSCOPY;  Service: Endoscopy;  Laterality: N/A;  . ESOPHAGOGASTRODUODENOSCOPY    . Right Rotator Cuff Repair Right   . SKIN CANCER EXCISION       FAMILY HISTORY   Family History  Problem Relation Age of Onset  . Heart attack Father   . Diabetes Father   . Heart Problems Maternal Grandmother      SOCIAL HISTORY   Social History   Tobacco Use  . Smoking status: Current Every Day Smoker    Packs/day: 1.00    Years: 45.00    Pack years: 45.00    Types: Cigarettes    Start date: 10/19/1974  . Smokeless tobacco: Never Used  Substance Use Topics  . Alcohol use: No    Alcohol/week: 0.0 standard drinks  . Drug use: No     MEDICATIONS    Home  Medication:    Current Medication:  Current Facility-Administered Medications:  .  albuterol (PROVENTIL) (2.5 MG/3ML) 0.083% nebulizer solution 2.5 mg, 2.5 mg, Nebulization, Q2H PRN, Gouru, Aruna, MD .  [COMPLETED] azithromycin (ZITHROMAX) 500 mg in sodium chloride 0.9 % 250 mL IVPB, 500 mg, Intravenous, Q24H, Stopped at 09/14/18 1920 **FOLLOWED BY** azithromycin (ZITHROMAX) tablet 500 mg, 500 mg, Oral, Daily, Gouru, Aruna, MD .  budesonide (PULMICORT) nebulizer solution 0.5 mg, 0.5 mg, Nebulization, BID, Dustin Flock, MD, 0.5 mg at 09/15/18 0725 .  buPROPion [facility] Xxx SR) 12 hr tablet 100 mg, 100 mg, Oral, BID, Gouru, Aruna, MD, 100 mg at 09/14/18 2043 .  cyclobenzaprine (FLEXERIL) tablet 20 mg, 20 mg, Oral, QHS, Gouru, Aruna, MD, 20 mg at 09/14/18 2042 .  DULoxetine (CYMBALTA) DR capsule 60 mg, 60 mg, Oral, Daily, Gouru, Aruna, MD, 60 mg at 09/14/18 0748 .  enoxaparin (LOVENOX) injection 40 mg, 40 mg, Subcutaneous, Q24H, Patel, Shreyang, MD .  feeding supplement (ENSURE ENLIVE) (ENSURE ENLIVE) liquid 237 mL, 237 mL,  Oral, BID BM, Dustin Flock, MD, 237 mL at 09/14/18 1259 .  ferrous sulfate tablet 325 mg, 325 mg, Oral, BID, Gouru, Aruna, MD, 325 mg at 09/14/18 2043 .  gabapentin (NEURONTIN) capsule 300 mg, 300 mg, Oral, TID, Gouru, Aruna, MD, 300 mg at 09/14/18 2042 .  guaiFENesin (MUCINEX) 12 hr tablet 600 mg, 600 mg, Oral, BID, Dustin Flock, MD, 600 mg at 09/14/18 2043 .  ipratropium-albuterol (DUONEB) 0.5-2.5 (3) MG/3ML nebulizer solution 3 mL, 3 mL, Nebulization, TID, Gouru, Aruna, MD, 3 mL at 09/15/18 0725 .  morphine (MSIR) tablet 15 mg, 15 mg, Oral, Q4H PRN, Gouru, Aruna, MD, 15 mg at 09/14/18 2154 .  multivitamin with minerals tablet 1 tablet, 1 tablet, Oral, Daily, Dustin Flock, MD .  pantoprazole (PROTONIX) EC tablet 40 mg, 40 mg, Oral, Daily, Gouru, Aruna, MD, 40 mg at 09/14/18 0750 .  [COMPLETED] methylPREDNISolone sodium succinate (SOLU-MEDROL) 125 mg/2 mL injection 60  mg, 60 mg, Intravenous, Q8H, 60 mg at 09/14/18 1553 **FOLLOWED BY** predniSONE (DELTASONE) tablet 40 mg, 40 mg, Oral, Q breakfast, Gouru, Aruna, MD .  primidone (MYSOLINE) tablet 250 mg, 250 mg, Oral, QID, Gouru, Aruna, MD, 250 mg at 09/14/18 2047 .  rosuvastatin (CRESTOR) tablet 10 mg, 10 mg, Oral, Daily, Gouru, Aruna, MD, 10 mg at 09/14/18 0749 .  sodium chloride flush (NS) 0.9 % injection 3 mL, 3 mL, Intravenous, PRN, Dustin Flock, MD, 3 mL at 09/14/18 1556 .  traZODone (DESYREL) tablet 150 mg, 150 mg, Oral, QHS PRN, Gouru, Aruna, MD, 150 mg at 09/14/18 2047    ALLERGIES   Oxycodone, Cephalexin, Hydrocodone, and Penicillins     REVIEW OF SYSTEMS    Review of Systems:  Gen:  Denies  fever, sweats, chills weigh loss  HEENT: Denies blurred vision, double vision, ear pain, eye pain, hearing loss, nose bleeds, sore throat Cardiac:  No dizziness, chest pain or heaviness, chest tightness,edema Resp:   Denies cough or sputum porduction, shortness of breath,wheezing, hemoptysis,  Gi: Denies swallowing difficulty, stomach pain, nausea or vomiting, diarrhea, constipation, bowel incontinence Gu:  Denies bladder incontinence, burning urine Ext:   Denies Joint pain, stiffness or swelling Skin: Denies  skin rash, easy bruising or bleeding or hives Endoc:  Denies polyuria, polydipsia , polyphagia or weight change Psych:   Denies depression, insomnia or hallucinations   Other:  All other systems negative   VS: BP (!) 98/56 (BP Location: Left Arm)   Pulse 77   Temp 98.6 F (37 C) (Oral)   Resp 19   Ht 5\' 2"  (1.575 m)   Wt 48 kg   LMP  (LMP Unknown)   SpO2 94%   BMI 19.35 kg/m      PHYSICAL EXAM    GENERAL:NAD, no fevers, chills, no weakness no fatigue HEAD: Normocephalic, atraumatic.  EYES: Pupils equal, round, reactive to light. Extraocular muscles intact. No scleral icterus.  MOUTH: Moist mucosal membrane. Dentition intact. No abscess noted.  EAR, NOSE, THROAT: Clear  without exudates. No external lesions.  NECK: Supple. No thyromegaly. No nodules. No JVD.  PULMONARY: mild rhonchorous breath sounds bilaterally  CARDIOVASCULAR: S1 and S2. Regular rate and rhythm. No murmurs, rubs, or gallops. No edema. Pedal pulses 2+ bilaterally.  GASTROINTESTINAL: Soft, nontender, nondistended. No masses. Positive bowel sounds. No hepatosplenomegaly.  MUSCULOSKELETAL: No swelling, clubbing, or edema. Range of motion full in all extremities.  NEUROLOGIC: Cranial nerves II through XII are intact. No gross focal neurological deficits. Sensation intact. Reflexes intact.  SKIN: No ulceration,  lesions, rashes, or cyanosis. Skin warm and dry. Turgor intact.  PSYCHIATRIC: Mood, affect within normal limits. The patient is awake, alert and oriented x 3. Insight, judgment intact.       IMAGING    Dg Chest Port 1 View  Result Date: 09/13/2018 CLINICAL DATA:  Acute shortness of breath for 4 days EXAM: PORTABLE CHEST 1 VIEW COMPARISON:  02/18/2015 and prior radiographs FINDINGS: The cardiomediastinal silhouette is unremarkable. There is no evidence of focal airspace disease, pulmonary edema, suspicious pulmonary nodule/mass, pleural effusion, or pneumothorax. No acute bony abnormalities are identified. IMPRESSION: No active disease. Electronically Signed   By: Margarette Canada M.D.   On: 09/13/2018 17:28      ASSESSMENT/PLAN   Acute hypoxemic respiratory failure  - Due to Acute COPD exacerbation   - She is currently on solumedrol 60 q8h  - she is on zithromax for antimicrobial therapy   - continue DuoNeb q6h  - IS at bedside   - discussed need for O2 concentrator for home   - made appt for outpatient follow up in pulmonary clinic, will need full PFTs and inhaler regimen optimization    -discussed with Dr Posey Pronto - appreciate collaboration  -MEtaneb q4h starting today    Tobacco abuse  - counseling provided for smoking cessation today >10 min     Lung cancer screening   -  high pretest probability due to lifelong smoking and >30 lb weight loss in past1-2 years    - will discuss Low dose CT chest once in chronic stable state      Thank you for allowing me to participate in the care of this patient.    Patient/Family are satisfied with care plan and all questions have been answered.  This document was prepared using Dragon voice recognition software and may include unintentional dictation errors.     Ottie Glazier, M.D.  Division of [place]

## 2018-09-15 NOTE — Progress Notes (Signed)
SATURATION QUALIFICATIONS: (This note is used to comply with regulatory documentation for home oxygen)  Patient Saturations on Room Air at Rest = 88%  Patient Saturations on Room Air while Ambulating = 82%  Patient Saturations on 3 Liters of oxygen while Ambulating = 92%  Please briefly explain why patient needs home oxygen:COPD

## 2018-09-15 NOTE — Evaluation (Signed)
Occupational Therapy Evaluation Patient Details Name: Laura Bates MRN: [id number] DOB: 1950/04/11 Today's Date: 09/15/2018    History of Present Illness Laura Bates comes to [facility] on 7/25 c SOB, SpO2 in 80s%, now requring 4L. PMH: COPD, depression, current smoker, HLD, chronic lumbar spine related leg pain. Pt reports 2 recent falls, which she attributes to her pain medication.   Clinical Impression   Pt seen for OT evaluation this date. Pt was independent in all basic ADL and very short household mobility (2/2 chronic low back/BLE pain), living in a 1 story level entry apartment with her spouse and sister. No home O2 use prior to this admission. Pt reports over past month or so becoming easily fatigued or out of breath with minimize exertion and endorses 2 falls recently (pt attributes this to change in medication dose but unable to recall which medication). Pt currently on 3L O2 and requires minimal assist for LB ADL and sup-CGA for functional mobility due to poor activity tolerance, chronic pain, and generalized weakness. Pt reports being significantly limited with all activity 2/2 pain, which worsens when sitting or standing for >5 minutes. Pt typically stays in bed lying on her side for pain relief. Pt does report that sitting on the toilet is one of the "easiest" things to do 2/2 decreased pressure on her sacrum. Pt educated in energy conservation conservation strategies including pursed lip breathing, activity pacing, home/routines modifications, work simplification, AE/DME, prioritizing of meaningful occupations, benefits of a shower chair and possibly a donut pillow to sit on, and falls prevention. Handout provided. Pt verbalized understanding and would benefit from additional skilled OT services to maximize recall and carryover of learned techniques and facilitate implementation of learned techniques into daily routines. Upon discharge, recommend HHOT services to address falls, implementing  energy conservation strategies into her daily routines, home O2 use and mgt (as applicable), pain coping skills, and self mgt of COPD in order to maximize safety and independence, minimize falls risk, and minimize risk of readmission for COPD exacerbation. RNCM notified.    Follow Up Recommendations  Home health OT    Equipment Recommendations  Other (comment);Tub/shower seat(donut pillow, reacher, shower chair with open center)    Recommendations for Other Services       Precautions / Restrictions Precautions Precautions: Fall Precaution Comments: watch O2 sats with exertion Restrictions Weight Bearing Restrictions: No      Mobility Bed Mobility Overal bed mobility: Modified Independent                Transfers Overall transfer level: Modified independent Equipment used: None                  Balance Overall balance assessment: Mild deficits observed, not formally tested;Modified Independent                                         ADL either performed or assessed with clinical judgement   ADL Overall ADL's : Needs assistance/impaired                                       General ADL Comments: bed level or sitting EOB for brief periods of time (2/2 back pain), PRN Min A for LB ADL     Vision Baseline Vision/History: Wears glasses Wears Glasses: Reading only  Patient Visual Report: No change from baseline       Perception     Praxis      Pertinent Vitals/Pain Pain Assessment: 0-10 Pain Score: 5  Pain Location: low back, shooting into BLE (R>L) Pain Descriptors / Indicators: Pins and needles;Sharp;Shooting Pain Intervention(s): Limited activity within patient's tolerance;Monitored during session;Repositioned     Hand Dominance Left   Extremity/Trunk Assessment Upper Extremity Assessment Upper Extremity Assessment: Generalized weakness   Lower Extremity Assessment Lower Extremity Assessment: Generalized  weakness   Cervical / Trunk Assessment Cervical / Trunk Assessment: Normal   Communication Communication Communication: No difficulties   Cognition Arousal/Alertness: Awake/alert Behavior During Therapy: WFL for tasks assessed/performed Overall Cognitive Status: Within Functional Limits for tasks assessed                                 General Comments: some decreased recall of medication names; but otherwise alert and oriented   General Comments       Exercises Other Exercises Other Exercises: Pt instructed in pursed lip breathing, home/routines modifications, AE/DME to minimize bending/low back pain exacerbation, work simplification, activity pacing, and importance of positioning of both herself and environment to match the task Other Exercises: pt educated in falls prevention including med mgt, footwear, and risk of over exertion   Shoulder Instructions      Home Living Family/patient expects to be discharged to:: Private residence Living Arrangements: Spouse/significant other;Other relatives(sister lives with pt and spouse) Available Help at Discharge: Family Type of Home: Apartment Home Access: Level entry     Home Layout: One level     Bathroom Shower/Tub: Corporate investment banker: Standard     Home Equipment: None   Additional Comments: sister has shower chair, w/c, and walker in storage; no AD/DME in the home      Prior Functioning/Environment Level of Independence: Needs assistance  Gait / Transfers Assistance Needed: pt indep with short household distances (bed to/from kitchen and bathroom) 2/2 pain ADL's / Homemaking Assistance Needed: indep with basic ADL, med mgt using a weekly pill organizer and list to stay organized; spouse does cooking/cleaning; pt does not drive - spouse drives   Comments: no home O2; pt endorses 2 falls in past 12 months (more recently) and attributes these falls to when her MD increased a dose of  her medication (does not recall which one)        OT Problem List: Decreased strength;Pain;Decreased activity tolerance;Decreased knowledge of use of DME or AE      OT Treatment/Interventions: Self-care/ADL training;Therapeutic exercise;Therapeutic activities;Energy conservation;DME and/or AE instruction;Patient/family education    OT Goals(Current goals can be found in the care plan section) Acute Rehab OT Goals Patient Stated Goal: feel better and go home OT Goal Formulation: With patient Time For Goal Achievement: 09/29/18 Potential to Achieve Goals: Good ADL Goals Additional ADL Goal #1: Pt will verbalize plan to implement at least 1 learned energy conservation strategy to maximize safety/indep with ADL Additional ADL Goal #2: Pt will perform UB/LB dressing task from sitting and standing position utilizing learned strategies to minimize low back strain and SOB with supervision.  OT Frequency: Min 1X/week   Barriers to D/C:            Co-evaluation              AM-PAC OT "6 Clicks" Daily Activity     Outcome Measure Help from another person  eating meals?: None Help from another person taking care of personal grooming?: None Help from another person toileting, which includes using toliet, bedpan, or urinal?: A Little Help from another person bathing (including washing, rinsing, drying)?: A Little Help from another person to put on and taking off regular upper body clothing?: None Help from another person to put on and taking off regular lower body clothing?: A Little 6 Click Score: 21   End of Session    Activity Tolerance: Patient tolerated treatment well Patient left: in bed;with call bell/phone within reach;with bed alarm set;with nursing/sitter in room  OT Visit Diagnosis: Other abnormalities of gait and mobility (R26.89);History of falling (Z91.81);Muscle weakness (generalized) (M62.81);Pain Pain - Right/Left: Right(both, R worse than L) Pain - part of body:  Leg(and low back)                Time: 8242-3536 OT Time Calculation (min): 31 min Charges:  OT General Charges $OT Visit: 1 Visit OT Evaluation $OT Eval Moderate Complexity: 1 Mod OT Treatments $Self Care/Home Management : 8-22 mins $Therapeutic Activity: 8-22 mins  Jeni Salles, MPH, MS, OTR/L ascom [phone] 09/15/18, 10:09 AM

## 2018-09-15 NOTE — TOC Initial Note (Signed)
Transition of Care [facility]) - Initial/Assessment Note    Patient Details  Name: Laura Bates MRN: [id number] Date of Birth: 1950-03-02  Transition of Care [facility]) CM/SW Contact:    Puneet Selden, Lenice Llamas Phone Number: [phone]  09/15/2018, 2:44 PM  Clinical Narrative: Clinical Social Worker (CSW) met with patient to discuss D/C plan. Patient was alert and oriented X4 and was laying in the bed. CSW introduced self and explained role of CSW department. Per patient she lives in [place] with her husband Jenny Reichmann and her sister. CSW provided patient home health list and explained home health. Patient refused home health and stated that she does not need it. Patient reported that her husband will take care of her at home. Patient reported that she is agreeable to home oxygen if needed. RN aware of above and agreed to document qualifying sats for oxygen. CSW sent [employer] agency representative an email making him aware of above. CSW will continue to follow and assist as needed.                     Expected Discharge Plan: Home/Self Care Barriers to Discharge: Continued Medical Work up   Patient Goals and CMS Choice Patient states their goals for this hospitalization and ongoing recovery are:: To feel better. CMS Medicare.gov Compare Post Acute Care list provided to:: Patient Choice offered to / list presented to : Patient  Expected Discharge Plan and Services Expected Discharge Plan: Home/Self Care In-house Referral: Clinical Social Work   Post Acute Care Choice: [place] arrangements for the past 2 months: Single Family Home                           HH Arranged: Refused HH          Prior Living Arrangements/Services Living arrangements for the past 2 months: Single Family Home Lives with:: Spouse, Siblings Patient language and need for interpreter reviewed:: No Do you feel safe going back to the place where you live?: Yes      Need for Family Participation in  Patient Care: Yes (Comment) Care giver support system in place?: Yes (comment)   Criminal Activity/Legal Involvement Pertinent to Current Situation/Hospitalization: No - Comment as needed  Activities of Daily Living Home Assistive Devices/Equipment: None ADL Screening (condition at time of admission) Patient's cognitive ability adequate to safely complete daily activities?: Yes Is the patient deaf or have difficulty hearing?: No Does the patient have difficulty seeing, even when wearing glasses/contacts?: No Does the patient have difficulty concentrating, remembering, or making decisions?: Yes Patient able to express need for assistance with ADLs?: Yes Does the patient have difficulty dressing or bathing?: No Independently performs ADLs?: Yes (appropriate for developmental age) Does the patient have difficulty walking or climbing stairs?: No Weakness of Legs: None Weakness of Arms/Hands: None  Permission Sought/Granted Permission sought to share information with : Other (comment)(DME Agency) Permission granted to share information with : Yes, Verbal Permission Granted              Emotional Assessment Appearance:: Appears stated age   Affect (typically observed): Pleasant, Calm Orientation: : Oriented to Self, Oriented to Place, Oriented to  Time, Oriented to Situation Alcohol / Substance Use: Not Applicable Psych Involvement: No (comment)  Admission diagnosis:  Hypoxia [R09.02] COPD exacerbation ([place]) [J44.1] Patient Active Problem List   Diagnosis Date Noted  . COPD with acute exacerbation ([place]) 09/13/2018  . Exertional  dyspnea 03/09/2015  . Tobacco use 03/09/2015  . H/O shoulder surgery 10/14/2014  . Allergic state 08/16/2014  . Rupture long head biceps tendon 07/22/2014  . Sprain of right rotator cuff capsule 07/22/2014  . Can't get food down 01/27/2013  . Abnormal finding on GI tract imaging 01/27/2013  . Cough 07/19/2011  . H/O elevated lipids 07/19/2011  .  Awareness of heartbeats 07/19/2011  . Aggrieved 06/07/2011  . Clinical depression 06/06/2011  . Acid reflux 11/27/2010  . Anxiety 11/27/2010  . Back ache 11/27/2010  . EE (eosinophilic esophagitis) 31/25/0871  . 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 History of tobacco use 11/27/2010  . Cannot sleep 11/27/2010  . Restless leg 11/27/2010   PCP:  Clarisse Gouge, MD Pharmacy:   [place], [place] [address] Phone: [phone] Fax: [phone]  [id number] [id number]: [phone]: [phone]    Social Determinants of Health (SDOH) Interventions    Readmission Risk Interventions No flowsheet data found.

## 2018-09-15 NOTE — Progress Notes (Signed)
[place] [facility]                                                                                                                                                                                  Patient Demographics   Emmaclaire Switala, is a 68 y.o. female, DOB - 04-Aug-1950, ZOX:[id number]  Admit date - 09/13/2018   Admitting Physician Nicholes Mango, MD  Outpatient Primary MD for the patient is Clarisse Gouge, MD   LOS - 2  Subjective:  Shortness of breath is improved overnight blood pressure noted to be low    Review of Systems:   CONSTITUTIONAL: No documented fever. No fatigue, weakness. No weight gain, no weight loss.  EYES: No blurry or double vision.  ENT: No tinnitus. No postnasal drip. No redness of the oropharynx.  RESPIRATORY: No cough, no wheeze, no hemoptysis.  Positive dyspnea.  CARDIOVASCULAR: No chest pain. No orthopnea. No palpitations. No syncope.  GASTROINTESTINAL: No nausea, no vomiting or diarrhea. No abdominal pain. No melena or hematochezia.  GENITOURINARY: No dysuria or hematuria.  ENDOCRINE: No polyuria or nocturia. No heat or cold intolerance.  HEMATOLOGY: No anemia. No bruising. No bleeding.  INTEGUMENTARY: No rashes. No lesions.  MUSCULOSKELETAL: No arthritis. No swelling. No gout.  NEUROLOGIC: No numbness, tingling, or ataxia. No seizure-type activity.  PSYCHIATRIC: No anxiety. No insomnia. No ADD.    Vitals:   Vitals:   09/14/18 1932 09/14/18 2021 09/15/18 0413 09/15/18 0427  BP: 106/84  (!) 84/52 (!) 98/56  Pulse: (!) 102  86 77  Resp:   19   Temp: 99.2 F (37.3 C)  98.6 F (37 C)   TempSrc: Oral  Oral   SpO2: 93% 92% 94%   Weight:      Height:        Wt Readings from Last 3 Encounters:  09/13/18 48 kg  09/13/18 49 kg  11/15/16 54.4 kg     Intake/Output Summary (Last 24 hours) at 09/15/2018 1212 Last data filed at 09/15/2018 0400 Gross per 24 hour  Intake 730.01 ml  Output 1 ml  Net 729.01 ml     Physical Exam:   GENERAL: Pleasant-appearing in no apparent distress.  HEAD, EYES, EARS, NOSE AND THROAT: Atraumatic, normocephalic. Extraocular muscles are intact. Pupils equal and reactive to light. Sclerae anicteric. No conjunctival injection. No oro-pharyngeal erythema.  NECK: Supple. There is no jugular venous distention. No bruits, no lymphadenopathy, no thyromegaly.  HEART: Regular rate and rhythm,. No murmurs, no rubs, no clicks.  LUNGS: Improved air entry diminished breath sounds ABDOMEN: Soft, flat, nontender, nondistended. Has good bowel sounds. No hepatosplenomegaly appreciated.  EXTREMITIES: No evidence of  any cyanosis, clubbing, or peripheral edema.  +2 pedal and radial pulses bilaterally.  NEUROLOGIC: The patient is alert, awake, and oriented x3 with no focal motor or sensory deficits appreciated bilaterally.  SKIN: Moist and warm with no rashes appreciated.  Psych: Not anxious, depressed LN: No inguinal LN enlargement    Antibiotics   Anti-infectives (From admission, onward)   Start     Dose/Rate Route Frequency Ordered Stop   09/15/18 1900  azithromycin (ZITHROMAX) tablet 500 mg     500 mg Oral Daily 09/13/18 2025 09/19/18 1859   09/14/18 1900  azithromycin (ZITHROMAX) 500 mg in sodium chloride 0.9 % 250 mL IVPB     500 mg 250 mL/hr over 60 Minutes Intravenous Every 24 hours 09/13/18 2025 09/14/18 1920   09/13/18 1830  levofloxacin (LEVAQUIN) tablet 750 mg     750 mg Oral  Once 09/13/18 1820 09/13/18 1837      Medications   Scheduled Meds: . azithromycin  500 mg Oral Daily  . budesonide (PULMICORT) nebulizer solution  0.5 mg Nebulization BID  . buPROPion  100 mg Oral BID  . cyclobenzaprine  20 mg Oral QHS  . DULoxetine  60 mg Oral Daily  . enoxaparin (LOVENOX) injection  40 mg Subcutaneous Q24H  . feeding supplement (ENSURE ENLIVE)  237 mL Oral BID BM  . ferrous sulfate  325 mg Oral BID  . gabapentin  300 mg Oral TID  . guaiFENesin  600 mg Oral BID  .  ipratropium-albuterol  3 mL Nebulization TID  . multivitamin with minerals  1 tablet Oral Daily  . pantoprazole  40 mg Oral Daily  . predniSONE  40 mg Oral Q breakfast  . primidone  250 mg Oral QID  . rosuvastatin  10 mg Oral Daily   Continuous Infusions:  PRN Meds:.albuterol, morphine, sodium chloride flush, traZODone   Data Review:   Micro Results Recent Results (from the past 240 hour(s))  SARS Coronavirus 2 (CEPHEID- Performed in [place] hospital lab), Hosp Order     Status: None   Collection Time: 09/13/18  5:09 PM   Specimen: Nasopharyngeal Swab  Result Value Ref Range Status   SARS Coronavirus 2 NEGATIVE NEGATIVE Final    Comment: (NOTE) If result is NEGATIVE SARS-CoV-2 target nucleic acids are NOT DETECTED. The SARS-CoV-2 RNA is generally detectable in upper and lower  respiratory specimens during the acute phase of infection. The lowest  concentration of SARS-CoV-2 viral copies this assay can detect is 250  copies / mL. A negative result does not preclude SARS-CoV-2 infection  and should not be used as the sole basis for treatment or other  patient management decisions.  A negative result may occur with  improper specimen collection / handling, submission of specimen other  than nasopharyngeal swab, presence of viral mutation(s) within the  areas targeted by this assay, and inadequate number of viral copies  (<250 copies / mL). A negative result must be combined with clinical  observations, patient history, and epidemiological information. If result is POSITIVE SARS-CoV-2 target nucleic acids are DETECTED. The SARS-CoV-2 RNA is generally detectable in upper and lower  respiratory specimens dur ing the acute phase of infection.  Positive  results are indicative of active infection with SARS-CoV-2.  Clinical  correlation with patient history and other diagnostic information is  necessary to determine patient infection status.  Positive results do  not rule out  bacterial infection or co-infection with other viruses. If result is PRESUMPTIVE POSTIVE SARS-CoV-2 nucleic acids  MAY BE PRESENT.   A presumptive positive result was obtained on the submitted specimen  and confirmed on repeat testing.  While 2019 novel coronavirus  (SARS-CoV-2) nucleic acids may be present in the submitted sample  additional confirmatory testing may be necessary for epidemiological  and / or clinical management purposes  to differentiate between  SARS-CoV-2 and other Sarbecovirus currently known to infect humans.  If clinically indicated additional testing with an alternate test  methodology [phone]) is advised. The SARS-CoV-2 RNA is generally  detectable in upper and lower respiratory sp ecimens during the acute  phase of infection. The expected result is Negative. Fact Sheet for Patients:  StrictlyIdeas.no Fact Sheet for Healthcare Providers: BankingDealers.co.za This test is not yet approved or cleared by the [place] FDA and has been authorized for detection and/or diagnosis of SARS-CoV-2 by FDA under an Emergency Use Authorization (EUA).  This EUA will remain in effect (meaning this test can be used) for the duration of the COVID-19 declaration under Section 564(b)(1) of the Act, 21 U.S.C. section 360bbb-3(b)(1), unless the authorization is terminated or revoked sooner. Performed at [facility], [address] [place], [place] [address]   Culture, sputum-assessment     Status: None   Collection Time: 09/14/18  7:56 AM   Specimen: Sputum  Result Value Ref Range Status   Specimen Description SPUTUM  Final   Special Requests NONE  Final   Sputum evaluation   Final    THIS SPECIMEN IS ACCEPTABLE FOR SPUTUM CULTURE Performed at [facility], [address] [place], [place] [address]    Report Status 09/14/2018 FINAL  Final  Culture, respiratory     Status: None (Preliminary result)    Collection Time: 09/14/18  7:56 AM   Specimen: SPU  Result Value Ref Range Status   Specimen Description   Final    SPUTUM Performed at [facility], [address], [place], [place] [address]    Special Requests   Final    NONE Reflexed from [phone] Performed at [facility], [address] [place], [place] [address]    Gram Stain   Final    RARE WBC PRESENT, PREDOMINANTLY MONONUCLEAR RARE YEAST RARE GRAM POSITIVE RODS    Culture   Final    CULTURE REINCUBATED FOR BETTER GROWTH Performed at [facility], [place] [address] [place], [place] [address]    Report Status PENDING  Incomplete    Radiology Reports Ct Angio Chest Pe W Or Wo Contrast  Result Date: 09/15/2018 CLINICAL DATA:  PE suspected, intermediate probability, positive D-dimer. EXAM: CT ANGIOGRAPHY CHEST WITH CONTRAST TECHNIQUE: Multidetector CT imaging of the chest was performed using the standard protocol during bolus administration of intravenous contrast. Multiplanar CT image reconstructions and MIPs were obtained to evaluate the vascular anatomy. CONTRAST:  95mL OMNIPAQUE IOHEXOL 350 MG/ML SOLN COMPARISON:  Chest radiograph 09/13/2018 FINDINGS: Cardiovascular: Satisfactory opacification of the pulmonary arteries to the segmental level. No evidence of pulmonary embolism. Normal heart size. No pericardial effusion. Atherosclerotic disease of the thoracic aorta. Incidentally noted aberrant right subclavian artery. Mediastinum/Nodes: No enlarged mediastinal, hilar or axillary lymph nodes. Lungs/Pleura: There are multiple bilateral pulmonary nodules with the largest as follows. An 8 mm nodule within the right upper lobe (series 5, image 52). An 8 mm nodule within the right upper lobe (series 5, image 78). An 8 mm nodule with subtle spiculation in the inferior right upper lobe (series 5, image 99). There are also scattered bilateral  tree-in-bud opacities. No confluent consolidation. No pleural effusion or  pneumothorax. Upper Abdomen: No acute abnormality. Musculoskeletal: No acute bony abnormality. Partially imaged sclerotic lesion within the left L1 pedicle, nonspecific but possibly reflecting a bone island. Circumscribed fat density within the left paraspinal region at the T12-L1 level may reflect a small lipoma (series 5, image 279). Review of the MIP images confirms the above findings. IMPRESSION: - No evidence of pulmonary embolus. - Multiple bilateral pulmonary nodules measuring up to 8 mm. A nodule within the inferior right upper lobe demonstrates subtle spiculation. Non-contrast chest CT at 3 months is recommended. If the nodules are stable at time of repeat CT, then future CT at 18-24 months (from today's scan) is considered optional for low-risk patients, but is recommended for high-risk patients. This recommendation follows the consensus statement: Guidelines for Management of Incidental Pulmonary Nodules Detected on CT Images: From the Fleischner Society 2017; Radiology 2017; 284:228-243. - Scattered tree-in-bud opacities within the lungs. Findings may be infectious/inflammatory in etiology, or may reflect small airways disease. - Emphysema. - Incidentally noted aberrant right subclavian artery, an anatomic variant. Electronically Signed   By: Kellie Simmering   On: 09/15/2018 10:42   Dg Chest Port 1 View  Result Date: 09/13/2018 CLINICAL DATA:  Acute shortness of breath for 4 days EXAM: PORTABLE CHEST 1 VIEW COMPARISON:  02/18/2015 and prior radiographs FINDINGS: The cardiomediastinal silhouette is unremarkable. There is no evidence of focal airspace disease, pulmonary edema, suspicious pulmonary nodule/mass, pleural effusion, or pneumothorax. No acute bony abnormalities are identified. IMPRESSION: No active disease. Electronically Signed   By: Margarette Canada M.D.   On: 09/13/2018 17:28     CBC Recent Labs  Lab 09/13/18 1705 09/15/18 0401  WBC 12.5* 13.0*  HGB 14.5 12.9  HCT 45.1 40.2  PLT 269  242  MCV 99.6 98.5  MCH 32.0 31.6  MCHC 32.2 32.1  RDW 15.1 15.3  LYMPHSABS 2.1  --   MONOABS 0.8  --   EOSABS 0.1  --   BASOSABS 0.1  --     Chemistries  Recent Labs  Lab 09/13/18 1705 09/15/18 0401  NA 139 140  K 4.5 4.2  CL 101 101  CO2 32 32  GLUCOSE 93 107*  BUN 7* 7*  CREATININE 0.47 0.41*  CALCIUM 8.0* 8.1*   ------------------------------------------------------------------------------------------------------------------ estimated creatinine clearance is 51 mL/min (A) (by C-G formula based on SCr of 0.41 mg/dL (L)). ------------------------------------------------------------------------------------------------------------------ No results for input(s): HGBA1C in the last 72 hours. ------------------------------------------------------------------------------------------------------------------ No results for input(s): CHOL, HDL, LDLCALC, TRIG, CHOLHDL, LDLDIRECT in the last 72 hours. ------------------------------------------------------------------------------------------------------------------ No results for input(s): TSH, T4TOTAL, T3FREE, THYROIDAB in the last 72 hours.  Invalid input(s): FREET3 ------------------------------------------------------------------------------------------------------------------ No results for input(s): VITAMINB12, FOLATE, FERRITIN, TIBC, IRON, RETICCTPCT in the last 72 hours.  Coagulation profile No results for input(s): INR, PROTIME in the last 168 hours.  No results for input(s): DDIMER in the last 72 hours.  Cardiac Enzymes No results for input(s): CKMB, TROPONINI, MYOGLOBIN in the last 168 hours.  Invalid input(s): CK ------------------------------------------------------------------------------------------------------------------ Invalid input(s): [place]   #Acute hypoxemic respiratory failure from COPD exacerbation Continue Solu-Medrol Continue Pulmicort  Continue nebulizer therapy Continue  oral antibiotics Appreciate pulmonary input CT scan of the chest shows nonspecific findings  #Hypotension I will try some midodrine follow blood pressure  #Insomnia continue home medication trazodone  #Chronic history of anxiety continue her home medication Cymbalta  #GERD PPI  #Tobacco abuse disorder patient was counseled  yesterday          Consults pulmonary  DVT Prophylaxis  Lovenox  Lab Results  Component Value Date   PLT 242 09/15/2018     Time Spent in minutes   35 minutes  Greater than 50% of time spent in care coordination and counseling patient regarding the condition and plan of care.   Dustin Flock M.D on 09/15/2018 at 12:12 PM  Between 7am to 6pm - Pager - [phone]  After 6pm go to [email or web address] - Proofreader  [employer]   Office  [phone]

## 2018-09-16 LAB — CULTURE, RESPIRATORY W GRAM STAIN: Culture: NORMAL

## 2018-09-16 LAB — HIV ANTIBODY (ROUTINE TESTING W REFLEX): HIV Screen 4th Generation wRfx: NONREACTIVE

## 2018-09-16 LAB — CORTISOL: Cortisol, Plasma: 1.4 ug/dL

## 2018-09-16 MED ORDER — MIDODRINE HCL 5 MG PO TABS
5.0000 mg | ORAL_TABLET | Freq: Three times a day (TID) | ORAL | Status: DC
  Administered 2018-09-16: 09:00:00 5 mg via ORAL
  Filled 2018-09-16: qty 1

## 2018-09-16 MED ORDER — FLUTICASONE-SALMETEROL 250-50 MCG/DOSE IN AEPB: 1.0000 | INHALATION_SPRAY | Freq: Two times a day (BID) | RESPIRATORY_TRACT | 0 refills | Status: AC

## 2018-09-16 MED ORDER — PREDNISONE 10 MG (21) PO TBPK: ORAL_TABLET | ORAL | 0 refills | Status: AC

## 2018-09-16 MED ORDER — IPRATROPIUM-ALBUTEROL 0.5-2.5 (3) MG/3ML IN SOLN: 3.0000 mL | Freq: Three times a day (TID) | RESPIRATORY_TRACT | 2 refills | Status: AC

## 2018-09-16 NOTE — Care Management Important Message (Signed)
Important Message  Patient Details  Name: Laura Bates MRN: [id number] Date of Birth: 07/08/1950   Medicare Important Message Given:  Yes     Juliann Pulse A Bra[place]house 09/16/2018, 10:54 AM

## 2018-09-16 NOTE — Discharge Summary (Signed)
[employer] - [place] at [facility], 68 y.o., DOB 08/29/1950, MRN [id number]. Admission date: 09/13/2018 Discharge Date 09/16/2018 Primary MD Clarisse Gouge, MD Admitting Physician Nicholes Mango, MD  Admission Diagnosis  Hypoxia [R09.02] COPD exacerbation [facility]) [J44.1]  Discharge Diagnosis   Active Problems:   COPD with acute exacerbation ([place]) Acute hypoxic respiratory failure Chronic hypotension Insomnia Chronic anxiety GERD Tobacco abuse   Hospital Course  Laura Bates  is a 68 y.o. female with a known history of anxiety, COPD, depression, hyperlipidemia restless leg syndrome, continues to smoke is presenting to the ED with a chief complaint of shortness of breath.  Patient had been to [facility] urgent care with worsening of shortness of breath for 4 days and she is sent over to the ED  as she was having shortness of breath requiring oxygen.  Patient was de-satting to 84% on room air requiring 4 L of oxygen.  Patient's chest x-ray was negative CT scan of the chest was negative for PE pulmonary embolism showed COPD changes.  Patient was seen by pulmonary who recommended outpatient follow-up.  Patient continued to need oxygen.  She will need oxygen on discharge with outpatient follow-up with pulmonary to see if this can be weaned off.           Consults  None  Significant Tests:  See full reports for all details    Ct Angio Chest Pe W Or Wo Contrast  Result Date: 09/15/2018 CLINICAL DATA:  PE suspected, intermediate probability, positive D-dimer. EXAM: CT ANGIOGRAPHY CHEST WITH CONTRAST TECHNIQUE: Multidetector CT imaging of the chest was performed using the standard protocol during bolus administration of intravenous contrast. Multiplanar CT image reconstructions and MIPs were obtained to evaluate the vascular anatomy. CONTRAST:  51mL OMNIPAQUE IOHEXOL 350 MG/ML SOLN COMPARISON:  Chest radiograph 09/13/2018 FINDINGS: Cardiovascular: Satisfactory  opacification of the pulmonary arteries to the segmental level. No evidence of pulmonary embolism. Normal heart size. No pericardial effusion. Atherosclerotic disease of the thoracic aorta. Incidentally noted aberrant right subclavian artery. Mediastinum/Nodes: No enlarged mediastinal, hilar or axillary lymph nodes. Lungs/Pleura: There are multiple bilateral pulmonary nodules with the largest as follows. An 8 mm nodule within the right upper lobe (series 5, image 52). An 8 mm nodule within the right upper lobe (series 5, image 78). An 8 mm nodule with subtle spiculation in the inferior right upper lobe (series 5, image 99). There are also scattered bilateral tree-in-bud opacities. No confluent consolidation. No pleural effusion or pneumothorax. Upper Abdomen: No acute abnormality. Musculoskeletal: No acute bony abnormality. Partially imaged sclerotic lesion within the left L1 pedicle, nonspecific but possibly reflecting a bone island. Circumscribed fat density within the left paraspinal region at the T12-L1 level may reflect a small lipoma (series 5, image 279). Review of the MIP images confirms the above findings. IMPRESSION: - No evidence of pulmonary embolus. - Multiple bilateral pulmonary nodules measuring up to 8 mm. A nodule within the inferior right upper lobe demonstrates subtle spiculation. Non-contrast chest CT at 3 months is recommended. If the nodules are stable at time of repeat CT, then future CT at 18-24 months (from today's scan) is considered optional for low-risk patients, but is recommended for high-risk patients. This recommendation follows the consensus statement: Guidelines for Management of Incidental Pulmonary Nodules Detected on CT Images: From the Fleischner Society 2017; Radiology 2017; 284:228-243. - Scattered tree-in-bud opacities within the lungs. Findings may be infectious/inflammatory in etiology, or may reflect small airways disease. - Emphysema. -  Incidentally noted aberrant right  subclavian artery, an anatomic variant. Electronically Signed   By: Kellie Simmering   On: 09/15/2018 10:42   Dg Chest Port 1 View  Result Date: 09/13/2018 CLINICAL DATA:  Acute shortness of breath for 4 days EXAM: PORTABLE CHEST 1 VIEW COMPARISON:  02/18/2015 and prior radiographs FINDINGS: The cardiomediastinal silhouette is unremarkable. There is no evidence of focal airspace disease, pulmonary edema, suspicious pulmonary nodule/mass, pleural effusion, or pneumothorax. No acute bony abnormalities are identified. IMPRESSION: No active disease. Electronically Signed   By: Margarette Canada M.D.   On: 09/13/2018 17:28       Today   Subjective:   Laura Bates patient doing well shortness of breath is improved very anxious to go home  Objective:   Blood pressure (!) 92/53, pulse 72, temperature 98.7 F (37.1 C), temperature source Oral, resp. rate 18, height 5\' 2"  (1.575 m), weight 51.4 kg, SpO2 91 %.  .  Intake/Output Summary (Last 24 hours) at 09/16/2018 1301 Last data filed at 09/16/2018 0111 Gross per 24 hour  Intake 620.98 ml  Output -  Net 620.98 ml    Exam VITAL SIGNS: Blood pressure (!) 92/53, pulse 72, temperature 98.7 F (37.1 C), temperature source Oral, resp. rate 18, height 5\' 2"  (1.575 m), weight 51.4 kg, SpO2 91 %.  GENERAL:  68 y.o.-year-old patient lying in the bed with no acute distress.  EYES: Pupils equal, round, reactive to light and accommodation. No scleral icterus. Extraocular muscles intact.  HEENT: Head atraumatic, normocephalic. Oropharynx and nasopharynx clear.  NECK:  Supple, no jugular venous distention. No thyroid enlargement, no tenderness.  LUNGS: Normal breath sounds bilaterally, no wheezing, rales,rhonchi or crepitation. No use of accessory muscles of respiration.  CARDIOVASCULAR: S1, S2 normal. No murmurs, rubs, or gallops.  ABDOMEN: Soft, nontender, nondistended. Bowel sounds present. No organomegaly or mass.  EXTREMITIES: No pedal edema, cyanosis, or  clubbing.  NEUROLOGIC: Cranial nerves II through XII are intact. Muscle strength 5/5 in all extremities. Sensation intact. Gait not checked.  PSYCHIATRIC: The patient is alert and oriented x 3.  SKIN: No obvious rash, lesion, or ulcer.   Data Review     CBC w Diff:  Lab Results  Component Value Date   WBC 13.0 (H) 09/15/2018   HGB 12.9 09/15/2018   HCT 40.2 09/15/2018   PLT 242 09/15/2018   LYMPHOPCT 17 09/13/2018   MONOPCT 6 09/13/2018   EOSPCT 1 09/13/2018   BASOPCT 1 09/13/2018   CMP:  Lab Results  Component Value Date   NA 140 09/15/2018   K 4.2 09/15/2018   CL 101 09/15/2018   CO2 32 09/15/2018   BUN 7 (L) 09/15/2018   CREATININE 0.41 (L) 09/15/2018  .  Micro Results Recent Results (from the past 240 hour(s))  SARS Coronavirus 2 (CEPHEID- Performed in [place] hospital lab), Hosp Order     Status: None   Collection Time: 09/13/18  5:09 PM   Specimen: Nasopharyngeal Swab  Result Value Ref Range Status   SARS Coronavirus 2 NEGATIVE NEGATIVE Final    Comment: (NOTE) If result is NEGATIVE SARS-CoV-2 target nucleic acids are NOT DETECTED. The SARS-CoV-2 RNA is generally detectable in upper and lower  respiratory specimens during the acute phase of infection. The lowest  concentration of SARS-CoV-2 viral copies this assay can detect is 250  copies / mL. A negative result does not preclude SARS-CoV-2 infection  and should not be used as the sole basis for treatment or other  patient management decisions.  A negative result may occur with  improper specimen collection / handling, submission of specimen other  than nasopharyngeal swab, presence of viral mutation(s) within the  areas targeted by this assay, and inadequate number of viral copies  (<250 copies / mL). A negative result must be combined with clinical  observations, patient history, and epidemiological information. If result is POSITIVE SARS-CoV-2 target nucleic acids are DETECTED. The SARS-CoV-2 RNA  is generally detectable in upper and lower  respiratory specimens dur ing the acute phase of infection.  Positive  results are indicative of active infection with SARS-CoV-2.  Clinical  correlation with patient history and other diagnostic information is  necessary to determine patient infection status.  Positive results do  not rule out bacterial infection or co-infection with other viruses. If result is PRESUMPTIVE POSTIVE SARS-CoV-2 nucleic acids MAY BE PRESENT.   A presumptive positive result was obtained on the submitted specimen  and confirmed on repeat testing.  While 2019 novel coronavirus  (SARS-CoV-2) nucleic acids may be present in the submitted sample  additional confirmatory testing may be necessary for epidemiological  and / or clinical management purposes  to differentiate between  SARS-CoV-2 and other Sarbecovirus currently known to infect humans.  If clinically indicated additional testing with an alternate test  methodology [phone]) is advised. The SARS-CoV-2 RNA is generally  detectable in upper and lower respiratory sp ecimens during the acute  phase of infection. The expected result is Negative. Fact Sheet for Patients:  StrictlyIdeas.no Fact Sheet for Healthcare Providers: BankingDealers.co.za This test is not yet approved or cleared by the [place] FDA and has been authorized for detection and/or diagnosis of SARS-CoV-2 by FDA under an Emergency Use Authorization (EUA).  This EUA will remain in effect (meaning this test can be used) for the duration of the COVID-19 declaration under Section 564(b)(1) of the Act, 21 U.S.C. section 360bbb-3(b)(1), unless the authorization is terminated or revoked sooner. Performed at [facility], [address] [place], [place] [address]   Culture, sputum-assessment     Status: None   Collection Time: 09/14/18  7:56 AM   Specimen: Sputum  Result Value Ref Range  Status   Specimen Description SPUTUM  Final   Special Requests NONE  Final   Sputum evaluation   Final    THIS SPECIMEN IS ACCEPTABLE FOR SPUTUM CULTURE Performed at [facility], [address], [place], [place] [address]    Report Status 09/14/2018 FINAL  Final  Culture, respiratory     Status: None   Collection Time: 09/14/18  7:56 AM   Specimen: SPU  Result Value Ref Range Status   Specimen Description   Final    SPUTUM Performed at [facility], [address], [place], [place] [address]    Special Requests   Final    NONE Reflexed from [id number] Performed at [facility], [address] [place], [place] [address]    Gram Stain   Final    RARE WBC PRESENT, PREDOMINANTLY MONONUCLEAR RARE YEAST RARE GRAM POSITIVE RODS    Culture   Final    Consistent with normal respiratory flora. Performed at [facility], [place] [address], [place],  [address]    Report Status 09/16/2018 FINAL  Final        Code Status Orders  (From admission, onward)         Start     Ordered   09/14/18 1855  Full code  Continuous     09/14/18 1855        Code Status History    This patient has a current code status but no historical code status.   Advance Care Planning Activity          Follow-up Information    Clarisse Gouge, MD Follow up in 1 week(s).   Specialty: Family Medicine Contact information: [place] [address] [phone]        Ottie Glazier, MD Follow up in 1 week(s).   Specialty: Pulmonary Disease Why: hosp f/u new copd Contact information: [place] [address] [phone]           Discharge Medications   Allergies as of 09/16/2018      Reactions   Oxycodone Itching   Cephalexin Swelling   Hydrocodone Other (See Comments)   Pt can take this med   Penicillins Hives, Rash, Other (See Comments)   Other Reaction: OTHER REACTION BLEEDING THRU S      Medication  List    TAKE these medications   albuterol 108 (90 Base) MCG/ACT inhaler Commonly known as: VENTOLIN HFA Inhale 2 puffs into the lungs every 4 (four) hours as needed.   buPROPion 100 MG 12 hr tablet Commonly known as: WELLBUTRIN SR Take 1 tablet (100 mg total) by mouth 2 (two) times daily. What changed: when to take this   cyclobenzaprine 10 MG tablet Commonly known as: FLEXERIL Take 20 mg by mouth at bedtime.   DULoxetine 60 MG capsule Commonly known as: Cymbalta Take 1 capsule (60 mg total) by mouth daily.   ferrous sulfate 325 (65 FE) MG tablet Take 325 mg by mouth 2 (two) times a day.   Fluticasone-Salmeterol 250-50 MCG/DOSE Aepb Commonly known as: Advair Diskus Inhale 1 puff into the lungs 2 (two) times daily.   gabapentin 300 MG capsule Commonly known as: NEURONTIN Take 300 mg by mouth 3 (three) times daily.   ipratropium-albuterol 0.5-2.5 (3) MG/3ML Soln Commonly known as: DUONEB Take 3 mLs by nebulization 3 (three) times daily.   morphine 15 MG tablet Commonly known as: MSIR Take 15 mg by mouth every 4 (four) hours as needed.   omeprazole 40 MG capsule Commonly known as: PRILOSEC Take 40 mg by mouth daily.   predniSONE 10 MG (21) Tbpk tablet Commonly known as: STERAPRED UNI-PAK 21 TAB Start at 60mg  taper by 10mg  until complete   primidone 250 MG tablet Commonly known as: MYSOLINE Take 250 mg by mouth 4 (four) times daily.   rosuvastatin 10 MG tablet Commonly known as: CRESTOR Take 10 mg by mouth daily.   traZODone 100 MG tablet Commonly known as: DESYREL Take 1.5 tablets (150 mg total) by mouth at bedtime as needed for sleep.            Durable Medical Equipment  (From admission, onward)         Start     Ordered   09/16/18 1259  For home use only DME Nebulizer machine  Once    Question Answer Comment  Patient needs a nebulizer to treat with the following condition COPD (chronic obstructive pulmonary disease) ([place])   Length of Need  Lifetime      09/16/18 1258   09/16/18 1229  DME Oxygen  Once    Question Answer Comment  Length of Need Lifetime   Mode or (Route) Nasal cannula   Liters per Minute 2   Frequency Continuous (stationary and portable  oxygen unit needed)   Oxygen delivery system Gas      09/16/18 1228   09/16/18 0000  For home use only DME Nebulizer machine    Question Answer Comment  Patient needs a nebulizer to treat with the following condition COPD (chronic obstructive pulmonary disease) ([place])   Length of Need Lifetime      09/16/18 0844             Total Time in preparing paper work, data evaluation and todays exam - 35 minutes  Dustin Flock M.D on 09/16/2018 at [facility]  [phone]

## 2018-09-16 NOTE — Progress Notes (Signed)
Occupational Therapy Treatment Patient Details Name: Laura Bates MRN: [id number] DOB: 12-14-1950 Today's Date: 09/16/2018    History of present illness Laura Bates comes to [facility] on 7/25 c SOB, SpO2 in 80s%, requring 4L. Now down to 2L. PMH: COPD, depression, current smoker, HLD, chronic lumbar spine related leg pain. Pt reports 2 recent falls, which she attributes to her pain medication.   OT comments  Pt seen for OT tx this date. Pt reports eager to return home and discharging today. Pt educated in ECS to support recall and carryover as well as new home O2 use to maximize safety and independence with self mgt of O2 at home. Pt verbalized understanding, RN to continue home O2 education with discharge instructions and pt directed to ask RN re: some of her O2 questions at that point. Pt continues to benefit from skilled OT services. Continue to recommend [facility] although noting pt declining HH services, per chart review.   Follow Up Recommendations  Home health OT    Equipment Recommendations  Other (comment);Tub/shower seat(donut pillow, reacher, shower chair with open center)    Recommendations for Other Services      Precautions / Restrictions Precautions Precautions: Fall Precaution Comments: watch O2 sats with exertion Restrictions Weight Bearing Restrictions: No       Mobility Bed Mobility Overal bed mobility: Modified Independent             General bed mobility comments: pt seated in bed with legs crossed, denies pain  Transfers                      Balance                                           ADL either performed or assessed with clinical judgement   ADL Overall ADL's : Needs assistance/impaired                                       General ADL Comments: bed level or sitting EOB for brief periods of time (2/2 back pain), PRN Min A for LB ADL     Vision Baseline Vision/History: Wears glasses Wears Glasses:  Reading only Patient Visual Report: No change from baseline     Perception     Praxis      Cognition Arousal/Alertness: Awake/alert Behavior During Therapy: WFL for tasks assessed/performed Overall Cognitive Status: Within Functional Limits for tasks assessed                                          Exercises Other Exercises Other Exercises: pt educated in ECS to support recall and carryover as well as new home O2 use to maximize safety and independence with self mgt of O2 at home   Shoulder Instructions       General Comments      Pertinent Vitals/ Pain       Pain Assessment: No/denies pain  Home Living  Prior Functioning/Environment              Frequency  Min 1X/week        Progress Toward Goals  OT Goals(current goals can now be found in the care plan section)     Acute Rehab OT Goals Patient Stated Goal: feel better and go home OT Goal Formulation: With patient Time For Goal Achievement: 09/29/18 Potential to Achieve Goals: Good  Plan Discharge plan remains appropriate;Frequency remains appropriate    Co-evaluation                 AM-PAC OT "6 Clicks" Daily Activity     Outcome Measure   Help from another person eating meals?: None Help from another person taking care of personal grooming?: None Help from another person toileting, which includes using toliet, bedpan, or urinal?: A Little Help from another person bathing (including washing, rinsing, drying)?: A Little Help from another person to put on and taking off regular upper body clothing?: None Help from another person to put on and taking off regular lower body clothing?: A Little 6 Click Score: 21    End of Session    OT Visit Diagnosis: Other abnormalities of gait and mobility (R26.89);History of falling (Z91.81);Muscle weakness (generalized) (M62.81)   Activity Tolerance Patient tolerated treatment  well   Patient Left in bed;with call bell/phone within reach;with bed alarm set   Nurse Communication          Time: 0721-8288 OT Time Calculation (min): 14 min  Charges: OT General Charges $OT Visit: 1 Visit OT Treatments $Self Care/Home Management : 8-22 mins  Jeni Salles, MPH, MS, OTR/L ascom [phone] 09/16/18, 2:56 PM

## 2018-09-16 NOTE — TOC Transition Note (Signed)
Transition of Care [facility]) - CM/SW Discharge Note   Patient Details  Name: Laura Bates MRN: [id number] Date of Birth: [birth year]  Transition of Care [facility]) CM/SW Contact:  Gordon Vandunk, [place], [place] Phone Number: [phone]  09/16/2018, 2:27 PM   Clinical Narrative: Charlynne Cousins DME agency representative delivered oxygen to patient's room. Per Leroy Sea he will deliver nebulizer machine to patient's room. Clinical Social Worker (CSW) met with patient and made her aware of above. Patient continued to refuse home health and reported that she will have her husband and sister for support at home. RN aware of above. Please reconsult if future social work needs arise. CSW signing off.      Final next level of care: Home/Self Care Barriers to Discharge: Barriers Resolved   Patient Goals and CMS Choice Patient states their goals for this hospitalization and ongoing recovery are:: To feel better CMS Medicare.gov Compare Post Acute Care list provided to:: Patient Choice offered to / list presented to : Patient  Discharge Placement                       Discharge Plan and Services In-house Referral: Clinical Social Work   Post Acute Care Choice: Home Health          DME Arranged: Nebulizer machine, Oxygen DME Agency: [employer] Date DME Agency Contacted: 09/16/18 Time DME Agency Contacted: 20 Representative spoke with at DME Agency: Leroy Sea [place]: Refused [place]          Social Determinants of Health ([place]) Interventions     Readmission Risk Interventions No flowsheet data found.

## 2018-09-16 NOTE — Progress Notes (Signed)
Pulmonary Medicine          Date: 09/16/2018,   MRN# [id number] Laura Bates November 12, 1950     AdmissionWeight: 48 kg                 CurrentWeight: 51.4 kg      CHIEF COMPLAINT:   Acute Exacerbation of COPD with acute hypoxemic respiratory failure   SUBJECTIVE    Patient feels better today. Optimizing for discharge home.     Remains on 2L/min [place] O2 therapy  Contacted [employer] [facility] regarding O2 concentrator for home use.   PAST MEDICAL HISTORY   Past Medical History:  Diagnosis Date   Allergic state    Anxiety    Back pain    COPD, mild (HCC)    Cough    Depression    Dysphagia    Eosinophilic esophagitis    Esophageal web    GERD (gastroesophageal reflux disease)    Grief at loss of child    H/O shoulder surgery    Hyperlipidemia    Insomnia    Palpitations    RLS (restless legs syndrome)    Rotator cuff tear - Right    Tobacco use      SURGICAL HISTORY   Past Surgical History:  Procedure Laterality Date   ABDOMINAL HYSTERECTOMY     benign tumor     breast   BREAST LUMPECTOMY     COLONOSCOPY WITH PROPOFOL N/A 10/18/2015   Procedure: COLONOSCOPY WITH PROPOFOL;  Surgeon: Lollie Sails, MD;  Location: [facility] ENDOSCOPY;  Service: Endoscopy;  Laterality: N/A;   ESOPHAGOGASTRODUODENOSCOPY     Right Rotator Cuff Repair Right    SKIN CANCER EXCISION       FAMILY HISTORY   Family History  Problem Relation Age of Onset   Heart attack Father    Diabetes Father    Heart Problems Maternal Grandmother      SOCIAL HISTORY   Social History   Tobacco Use   Smoking status: Current Every Day Smoker    Packs/day: 1.00    Years: 45.00    Pack years: 45.00    Types: Cigarettes    Start date: 10/19/1974   Smokeless tobacco: Never Used  Substance Use Topics   Alcohol use: No    Alcohol/week: 0.0 standard drinks   Drug use: No     MEDICATIONS    Home Medication:    Current Medication:  Current  Facility-Administered Medications:    albuterol (PROVENTIL) (2.5 MG/3ML) 0.083% nebulizer solution 2.5 mg, 2.5 mg, Nebulization, Q2H PRN, Gouru, Aruna, MD   [COMPLETED] azithromycin (ZITHROMAX) 500 mg in sodium chloride 0.9 % 250 mL IVPB, 500 mg, Intravenous, Q24H, Stopped at 09/14/18 1920 **FOLLOWED BY** azithromycin (ZITHROMAX) tablet 500 mg, 500 mg, Oral, Daily, Gouru, Aruna, MD, 500 mg at 09/15/18 1814   budesonide (PULMICORT) nebulizer solution 0.5 mg, 0.5 mg, Nebulization, BID, Dustin Flock, MD, 0.5 mg at 09/16/18 0816   buPROPion (WELLBUTRIN SR) 12 hr tablet 100 mg, 100 mg, Oral, BID, Gouru, Aruna, MD, 100 mg at 09/16/18 0840   cyclobenzaprine (FLEXERIL) tablet 20 mg, 20 mg, Oral, QHS, Gouru, Aruna, MD, 20 mg at 09/15/18 2152   DULoxetine (CYMBALTA) DR capsule 60 mg, 60 mg, Oral, Daily, Gouru, Aruna, MD, 60 mg at 09/16/18 0840   enoxaparin (LOVENOX) injection 40 mg, 40 mg, Subcutaneous, Q24H, Patel, Shreyang, MD   feeding supplement (ENSURE ENLIVE) (ENSURE ENLIVE) liquid 237 mL, 237 mL, Oral, BID BM, Posey Pronto, [place],  MD, 237 mL at 09/16/18 0842   ferrous sulfate tablet 325 mg, 325 mg, Oral, BID, Gouru, Aruna, MD, 325 mg at 09/16/18 0840   gabapentin (NEURONTIN) capsule 300 mg, 300 mg, Oral, TID, Gouru, Aruna, MD, 300 mg at 09/16/18 0839   guaiFENesin (MUCINEX) 12 hr tablet 600 mg, 600 mg, Oral, BID, Dustin Flock, MD, 600 mg at 09/16/18 0840   ipratropium-albuterol (DUONEB) 0.5-2.5 (3) MG/3ML nebulizer solution 3 mL, 3 mL, Nebulization, TID, Gouru, Aruna, MD, 3 mL at 09/16/18 0816   morphine (MSIR) tablet 15 mg, 15 mg, Oral, Q8H PRN, Dustin Flock, MD, 15 mg at 09/16/18 0840   multivitamin with minerals tablet 1 tablet, 1 tablet, Oral, Daily, Dustin Flock, MD, 1 tablet at 09/16/18 0839   pantoprazole (PROTONIX) EC tablet 40 mg, 40 mg, Oral, Daily, Gouru, Aruna, MD, 40 mg at 09/16/18 5465   [COMPLETED] methylPREDNISolone sodium succinate (SOLU-MEDROL) 125 mg/2 mL  injection 60 mg, 60 mg, Intravenous, Q8H, 60 mg at 09/14/18 1553 **FOLLOWED BY** predniSONE (DELTASONE) tablet 40 mg, 40 mg, Oral, Q breakfast, Gouru, Aruna, MD, 40 mg at 09/16/18 0840   primidone (MYSOLINE) tablet 250 mg, 250 mg, Oral, QID, Gouru, Aruna, MD, 250 mg at 09/16/18 0840   rosuvastatin (CRESTOR) tablet 10 mg, 10 mg, Oral, Daily, Gouru, Aruna, MD, 10 mg at 09/16/18 6812   sodium chloride flush (NS) 0.9 % injection 3 mL, 3 mL, Intravenous, PRN, Dustin Flock, MD, 3 mL at 09/14/18 1556   traZODone (DESYREL) tablet 150 mg, 150 mg, Oral, QHS PRN, Gouru, Aruna, MD, 150 mg at 09/15/18 2152    ALLERGIES   Oxycodone, Cephalexin, Hydrocodone, and Penicillins     REVIEW OF SYSTEMS    Review of Systems:  Gen:  Denies  fever, sweats, chills weigh loss  HEENT: Denies blurred vision, double vision, ear pain, eye pain, hearing loss, nose bleeds, sore throat Cardiac:  No dizziness, chest pain or heaviness, chest tightness,edema Resp:   Denies cough or sputum porduction, shortness of breath,wheezing, hemoptysis,  Gi: Denies swallowing difficulty, stomach pain, nausea or vomiting, diarrhea, constipation, bowel incontinence Gu:  Denies bladder incontinence, burning urine Ext:   Denies Joint pain, stiffness or swelling Skin: Denies  skin rash, easy bruising or bleeding or hives Endoc:  Denies polyuria, polydipsia , polyphagia or weight change Psych:   Denies depression, insomnia or hallucinations   Other:  All other systems negative   VS: BP (!) 92/53 (BP Location: Left Arm)    Pulse 72    Temp 98.7 F (37.1 C) (Oral)    Resp 18    Ht 5\' 2"  (1.575 m)    Wt 51.4 kg    LMP  (LMP Unknown)    SpO2 91%    BMI 20.73 kg/m      PHYSICAL EXAM    GENERAL:NAD, no fevers, chills, no weakness no fatigue HEAD: Normocephalic, atraumatic.  EYES: Pupils equal, round, reactive to light. Extraocular muscles intact. No scleral icterus.  MOUTH: Moist mucosal membrane. Dentition intact. No  abscess noted.  EAR, NOSE, THROAT: Clear without exudates. No external lesions.  NECK: Supple. No thyromegaly. No nodules. No JVD.  PULMONARY: mild rhonchorous breath sounds bilaterally  CARDIOVASCULAR: S1 and S2. Regular rate and rhythm. No murmurs, rubs, or gallops. No edema. Pedal pulses 2+ bilaterally.  GASTROINTESTINAL: Soft, nontender, nondistended. No masses. Positive bowel sounds. No hepatosplenomegaly.  MUSCULOSKELETAL: No swelling, clubbing, or edema. Range of motion full in all extremities.  NEUROLOGIC: Cranial nerves II through XII are intact.  No gross focal neurological deficits. Sensation intact. Reflexes intact.  SKIN: No ulceration, lesions, rashes, or cyanosis. Skin warm and dry. Turgor intact.  PSYCHIATRIC: Mood, affect within normal limits. The patient is awake, alert and oriented x 3. Insight, judgment intact.       IMAGING    Ct Angio Chest Pe W Or Wo Contrast  Result Date: 09/15/2018 CLINICAL DATA:  PE suspected, intermediate probability, positive D-dimer. EXAM: CT ANGIOGRAPHY CHEST WITH CONTRAST TECHNIQUE: Multidetector CT imaging of the chest was performed using the standard protocol during bolus administration of intravenous contrast. Multiplanar CT image reconstructions and MIPs were obtained to evaluate the vascular anatomy. CONTRAST:  56mL OMNIPAQUE IOHEXOL 350 MG/ML SOLN COMPARISON:  Chest radiograph 09/13/2018 FINDINGS: Cardiovascular: Satisfactory opacification of the pulmonary arteries to the segmental level. No evidence of pulmonary embolism. Normal heart size. No pericardial effusion. Atherosclerotic disease of the thoracic aorta. Incidentally noted aberrant right subclavian artery. Mediastinum/Nodes: No enlarged mediastinal, hilar or axillary lymph nodes. Lungs/Pleura: There are multiple bilateral pulmonary nodules with the largest as follows. An 8 mm nodule within the right upper lobe (series 5, image 52). An 8 mm nodule within the right upper lobe (series 5,  image 78). An 8 mm nodule with subtle spiculation in the inferior right upper lobe (series 5, image 99). There are also scattered bilateral tree-in-bud opacities. No confluent consolidation. No pleural effusion or pneumothorax. Upper Abdomen: No acute abnormality. Musculoskeletal: No acute bony abnormality. Partially imaged sclerotic lesion within the left L1 pedicle, nonspecific but possibly reflecting a bone island. Circumscribed fat density within the left paraspinal region at the T12-L1 level may reflect a small lipoma (series 5, image 279). Review of the MIP images confirms the above findings. IMPRESSION: - No evidence of pulmonary embolus. - Multiple bilateral pulmonary nodules measuring up to 8 mm. A nodule within the inferior right upper lobe demonstrates subtle spiculation. Non-contrast chest CT at 3 months is recommended. If the nodules are stable at time of repeat CT, then future CT at 18-24 months (from today's scan) is considered optional for low-risk patients, but is recommended for high-risk patients. This recommendation follows the consensus statement: Guidelines for Management of Incidental Pulmonary Nodules Detected on CT Images: From the Fleischner Society 2017; Radiology 2017; 284:228-243. - Scattered tree-in-bud opacities within the lungs. Findings may be infectious/inflammatory in etiology, or may reflect small airways disease. - Emphysema. - Incidentally noted aberrant right subclavian artery, an anatomic variant. Electronically Signed   By: Kellie Simmering   On: 09/15/2018 10:42   Dg Chest Port 1 View  Result Date: 09/13/2018 CLINICAL DATA:  Acute shortness of breath for 4 days EXAM: PORTABLE CHEST 1 VIEW COMPARISON:  02/18/2015 and prior radiographs FINDINGS: The cardiomediastinal silhouette is unremarkable. There is no evidence of focal airspace disease, pulmonary edema, suspicious pulmonary nodule/mass, pleural effusion, or pneumothorax. No acute bony abnormalities are identified.  IMPRESSION: No active disease. Electronically Signed   By: Margarette Canada M.D.   On: 09/13/2018 17:28      ASSESSMENT/PLAN   Acute hypoxemic respiratory failure  - Due to Acute COPD exacerbation   - She is currently on solumedrol 60 q8h  - she is on zithromax for antimicrobial therapy   - continue DuoNeb q6h  - IS at bedside   - discussed need for O2 concentrator for home   - made appt for outpatient follow up in pulmonary clinic, will need full PFTs and inhaler regimen optimization    -discussed with Dr Posey Pronto - appreciate collaboration  -  MEtaneb q4h    Tobacco abuse  - counseling provided for smoking cessation today >10 min     Lung cancer screening   - high pretest probability due to lifelong smoking and >30 lb weight loss in past1-2 years    - will discuss Low dose CT chest once in chronic stable state      Thank you for allowing me to participate in the care of this patient.    Patient/Family are satisfied with care plan and all questions have been answered.  This document was prepared using Dragon voice recognition software and may include unintentional dictation errors.     Ottie Glazier, M.D.  Division of [place]

## 2018-12-12 ENCOUNTER — Other Ambulatory Visit: Payer: Self-pay | Admitting: Pulmonary Disease

## 2018-12-12 ENCOUNTER — Other Ambulatory Visit (HOSPITAL_COMMUNITY): Payer: Self-pay | Admitting: Pulmonary Disease

## 2018-12-12 DIAGNOSIS — R918 Other nonspecific abnormal finding of lung field: Secondary | ICD-10-CM

## 2019-06-30 ENCOUNTER — Telehealth: Payer: Self-pay | Admitting: *Deleted

## 2019-06-30 NOTE — Telephone Encounter (Signed)
Contacted patient to inquire about follow up imaging of known lung nodules and consideration of lung screening in the future. Patient reports she has relocated to [place]. Encouraged patient to get a CT chest to follow up on nodules once a PCP is established. She verbalizes understanding.

## 2021-03-23 IMAGING — CT CT ANGIOGRAPHY CHEST
3 of 7 series · 18 of 36 positions shown · IV contrast (omnipaque)
Comparison: Chest radiograph 09/13/2018

CLINICAL DATA: PE suspected, intermediate probability, positive
D-dimer.

EXAM:
CT ANGIOGRAPHY CHEST WITH CONTRAST
TECHNIQUE: Multidetector CT imaging of the chest was performed using the
standard protocol during bolus administration of intravenous
contrast. Multiplanar CT image reconstructions and MIPs were
obtained to evaluate the vascular anatomy.
CONTRAST:  75mL OMNIPAQUE IOHEXOL 350 MG/ML SOLN

[Series 5: thins · axial · 0.61mm/px · z∈[-828,-586]mm · 15 of 279 slices shown]
[im 18/279  lung]
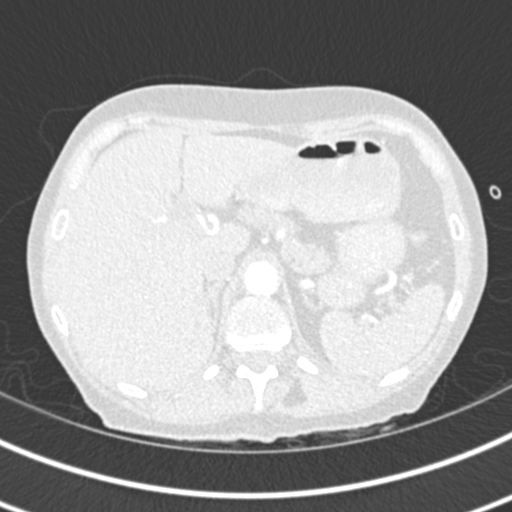
[im 35/279  mediastinal]
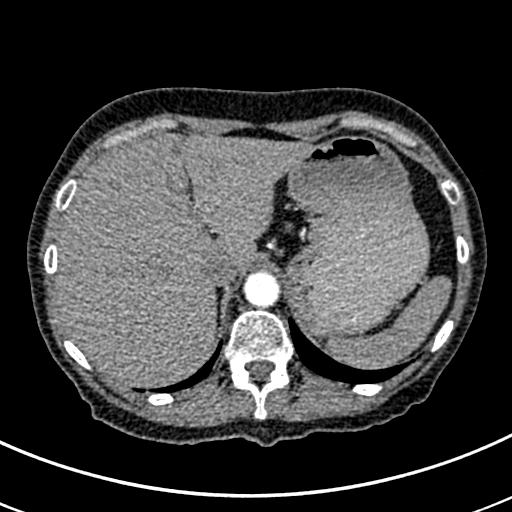
[im 53/279  lung]
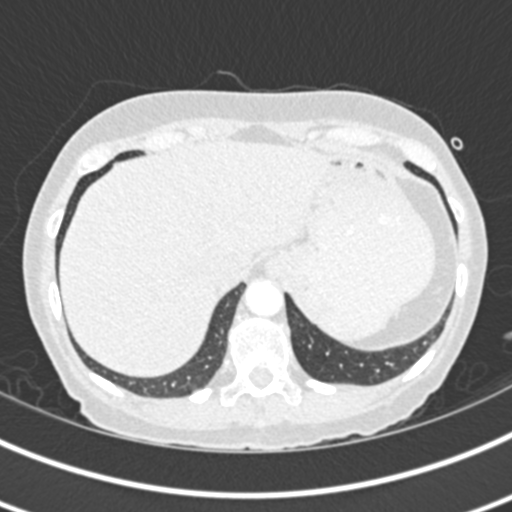
[im 70/279  mediastinal]
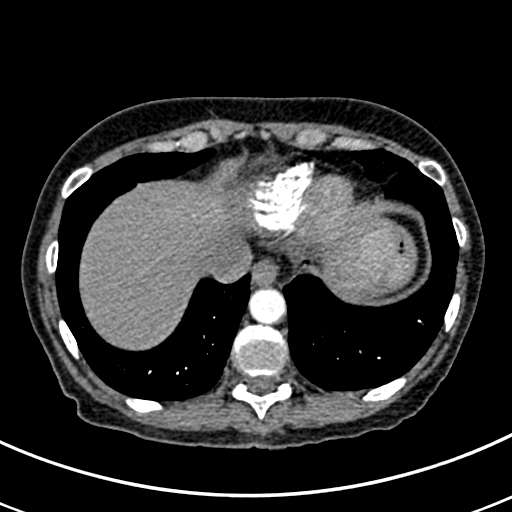
[im 87/279  lung]
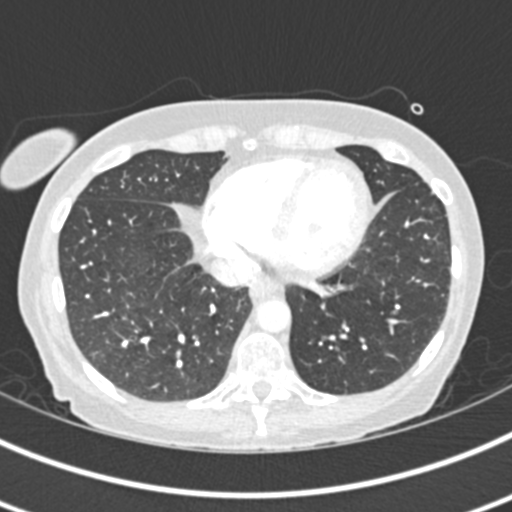
[im 105/279  mediastinal]
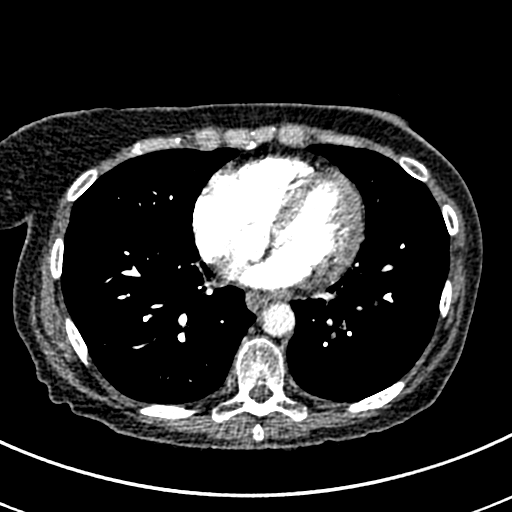
[im 122/279  lung]
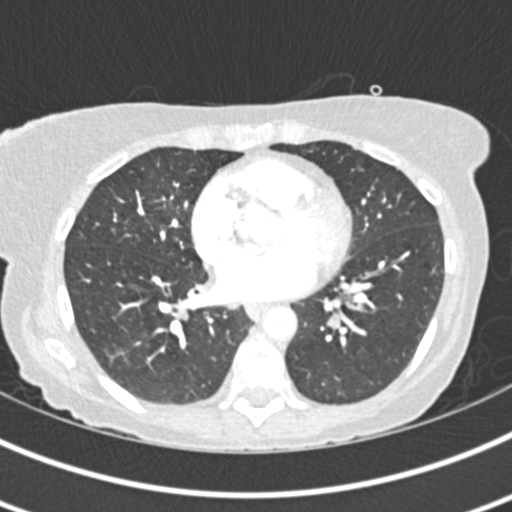
[im 140/279  mediastinal]
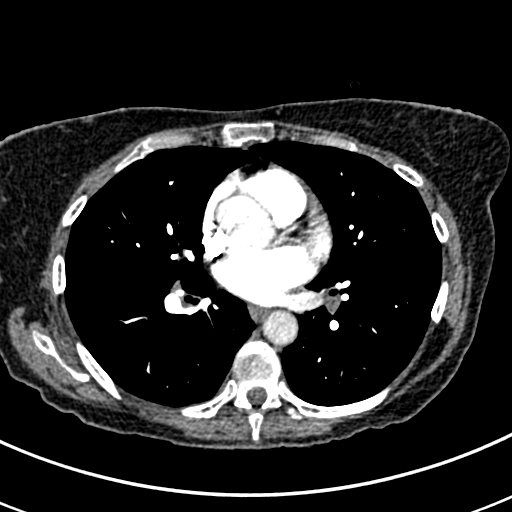
[im 157/279  lung]
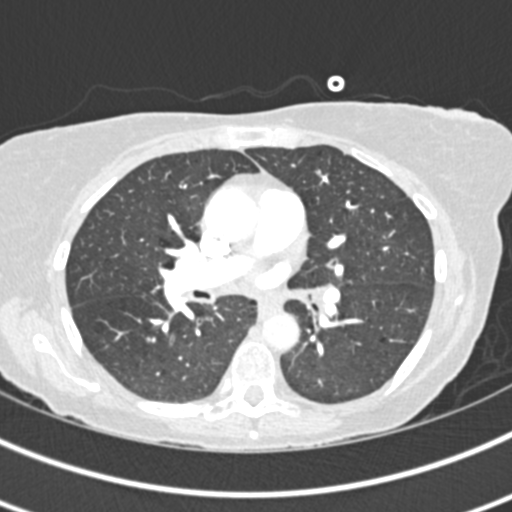
[im 174/279  mediastinal]
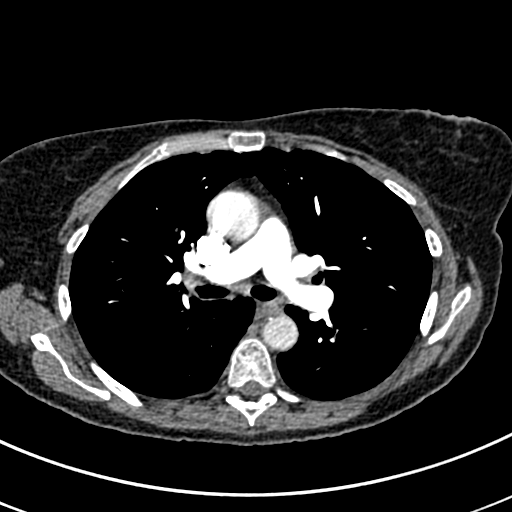
[im 192/279  lung]
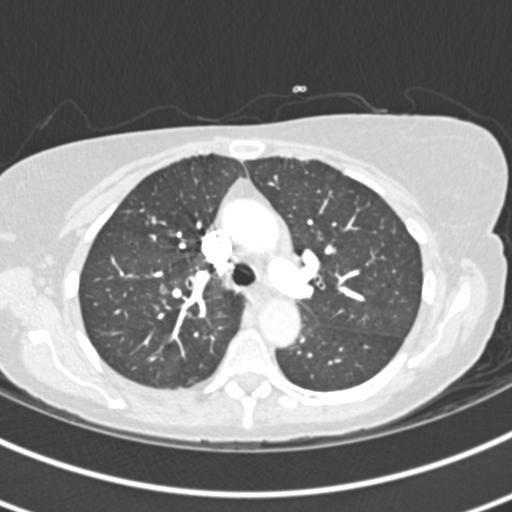
[im 209/279  mediastinal]
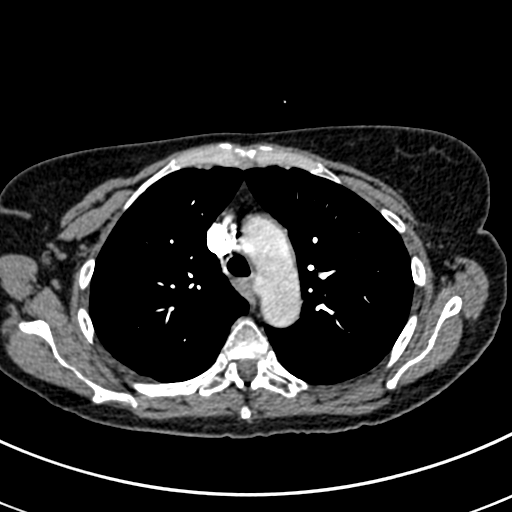
[im 226/279  lung]
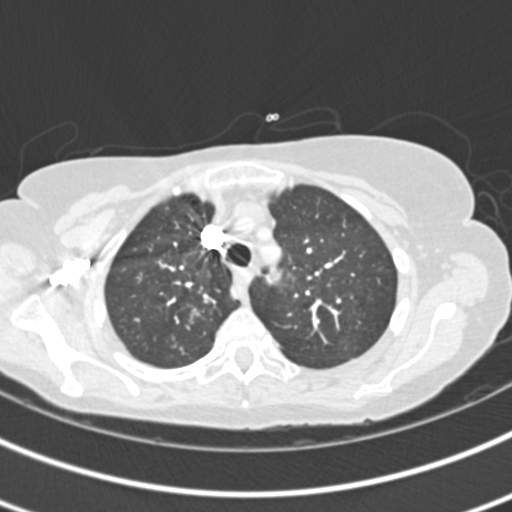
[im 244/279  mediastinal]
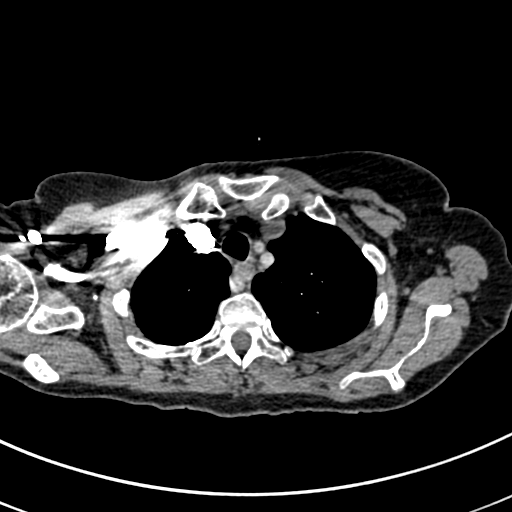
[im 261/279  lung]
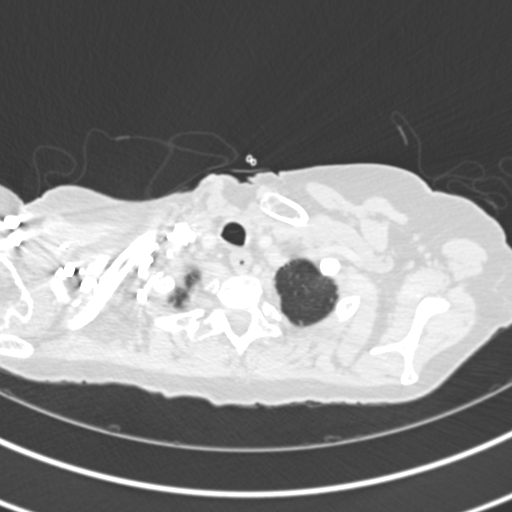

[Series 6: lung · axial · 0.61mm/px · z∈[-766,-700]mm · 2 of 88 slices shown]
[im 22/88  mediastinal]
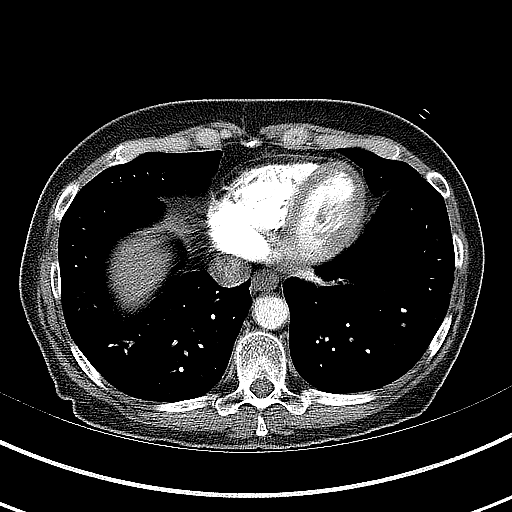
[im 44/88  mediastinal]
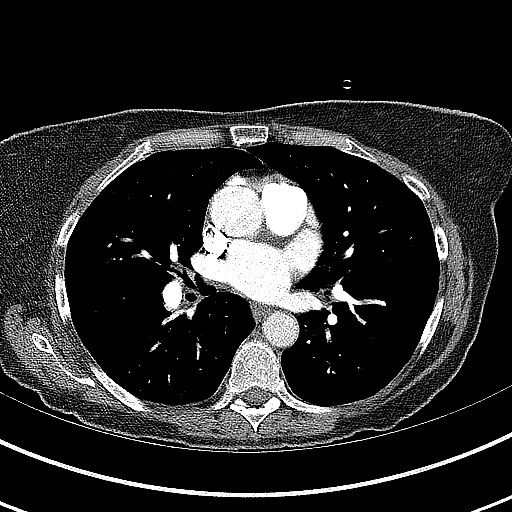

[Series 7: coronal mpr · coronal · 0.55mm/px · 1 of 84 slices shown]
[im 42/84  mediastinal]
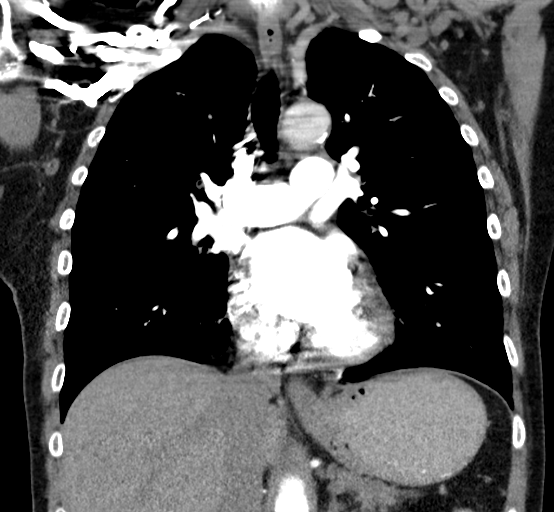

[18 of 36 positions shown; findings below may reference images not displayed]

FINDINGS: Cardiovascular: Satisfactory opacification of the pulmonary arteries
to the segmental level. No evidence of pulmonary embolism. Normal
heart size. No pericardial effusion. Atherosclerotic disease of the
thoracic aorta. Incidentally noted aberrant right subclavian artery.

Mediastinum/Nodes: No enlarged mediastinal, hilar or axillary lymph
nodes.

Lungs/Pleura: There are multiple bilateral pulmonary nodules with
the largest as follows. An 8 mm nodule within the right upper lobe
(series 5, image 52). An 8 mm nodule within the right upper lobe
(series 5, image 78). An 8 mm nodule with subtle spiculation in the
inferior right upper lobe (series 5, image 99). There are also
scattered bilateral tree-in-bud opacities. No confluent
consolidation. No pleural effusion or pneumothorax.

Upper Abdomen: No acute abnormality.

Musculoskeletal: No acute bony abnormality. Partially imaged
sclerotic lesion within the left L1 pedicle, nonspecific but
possibly reflecting a bone island. Circumscribed fat density within
the left paraspinal region at the T12-L1 level may reflect a small
lipoma (series 5, image 279).

Review of the MIP images confirms the above findings.
IMPRESSION: - No evidence of pulmonary embolus.

- Multiple bilateral pulmonary nodules measuring up to 8 mm. A
nodule within the inferior right upper lobe demonstrates subtle
spiculation. Non-contrast chest CT at 3 months is recommended. If
the nodules are stable at time of repeat CT, then future CT at 18-24
months (from today's scan) is considered optional for low-risk
patients, but is recommended for high-risk patients. This
recommendation follows the consensus statement: Guidelines for
Management of Incidental Pulmonary Nodules Detected on CT Images:

- Scattered tree-in-bud opacities within the lungs. Findings may be
infectious/inflammatory in etiology, or may reflect small airways
disease.

- Emphysema.

- Incidentally noted aberrant right subclavian artery, an anatomic
variant.
